# Patient Record
Sex: Female | Born: 1995 | Race: White | Hispanic: No | Marital: Single | Smoking: Never smoker
Health system: Southern US, Community
[De-identification: ages and names within clinical notes are randomized; demographics above are authoritative.]

## PROBLEM LIST (undated history)

## (undated) DIAGNOSIS — E785 Hyperlipidemia, unspecified: Secondary | ICD-10-CM

## (undated) HISTORY — PX: OTHER SURGICAL HISTORY: SHX169

---
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
## 2016-01-22 ENCOUNTER — Other Ambulatory Visit: Payer: Self-pay | Admitting: Gynecology

## 2016-09-13 ENCOUNTER — Ambulatory Visit (INDEPENDENT_AMBULATORY_CARE_PROVIDER_SITE_OTHER): Payer: BLUE CROSS/BLUE SHIELD | Admitting: Gynecology

## 2016-09-13 ENCOUNTER — Encounter: Payer: Self-pay | Admitting: Gynecology

## 2016-09-13 VITALS — BP 112/70 | Ht 63.5 in | Wt 115.0 lb

## 2016-09-13 DIAGNOSIS — Z01411 Encounter for gynecological examination (general) (routine) with abnormal findings: Secondary | ICD-10-CM

## 2016-09-13 DIAGNOSIS — R5383 Other fatigue: Secondary | ICD-10-CM | POA: Diagnosis not present

## 2016-09-13 LAB — COMPREHENSIVE METABOLIC PANEL
ALK PHOS: 42 U/L — AB (ref 47–176)
ALT: 10 U/L (ref 5–32)
AST: 12 U/L (ref 12–32)
Albumin: 4.2 g/dL (ref 3.6–5.1)
BUN: 16 mg/dL (ref 7–20)
CO2: 23 mmol/L (ref 20–31)
Calcium: 9.3 mg/dL (ref 8.9–10.4)
Chloride: 105 mmol/L (ref 98–110)
Creat: 0.89 mg/dL (ref 0.50–1.00)
Glucose, Bld: 96 mg/dL (ref 65–99)
Potassium: 4.4 mmol/L (ref 3.8–5.1)
SODIUM: 137 mmol/L (ref 135–146)
Total Bilirubin: 0.3 mg/dL (ref 0.2–1.1)
Total Protein: 6.7 g/dL (ref 6.3–8.2)

## 2016-09-13 LAB — CBC WITH DIFFERENTIAL/PLATELET
Basophils Absolute: 54 cells/uL (ref 0–200)
Basophils Relative: 1 %
EOS PCT: 2 %
Eosinophils Absolute: 108 cells/uL (ref 15–500)
HCT: 40.6 % (ref 35.0–45.0)
HEMOGLOBIN: 13.8 g/dL (ref 11.7–15.5)
LYMPHS ABS: 2160 {cells}/uL (ref 850–3900)
Lymphocytes Relative: 40 %
MCH: 28 pg (ref 27.0–33.0)
MCHC: 34 g/dL (ref 32.0–36.0)
MCV: 82.4 fL (ref 80.0–100.0)
MONOS PCT: 9 %
MPV: 8.9 fL (ref 7.5–12.5)
Monocytes Absolute: 486 cells/uL (ref 200–950)
NEUTROS PCT: 48 %
Neutro Abs: 2592 cells/uL (ref 1500–7800)
PLATELETS: 238 10*3/uL (ref 140–400)
RBC: 4.93 MIL/uL (ref 3.80–5.10)
RDW: 12.8 % (ref 11.0–15.0)
WBC: 5.4 10*3/uL (ref 3.8–10.8)

## 2016-09-13 LAB — CHOLESTEROL, TOTAL: CHOLESTEROL: 181 mg/dL — AB (ref <170)

## 2016-09-13 LAB — TSH: TSH: 2.05 m[IU]/L (ref 0.50–4.30)

## 2016-09-13 NOTE — Progress Notes (Signed)
   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 She has done well since we started her on norethindrone-ethinyl estradiol 1/35 (Ortho-Novum). She reports normal regular cycles. Is also help with her acne as well. She is not sexually active. She has completed the Gardasil Vaccine series at her pediatrician's office several years ago.  Exam: Gen. appearance well-developed: Nursing underwent acute distress HEENT: Unremarkable Lungs: Clear to auscultation rhonchus or wheezes Heart: Regular rate and rhythm no murmurs or gallops Breast exam: Not done Abdomen and pelvic exam not done Pelvic exam: Not done  Assessment/plan: 20 year old with normal annual exam because of tiredness and fatigue were going to check a comprehensive metabolic panel today along with a TSH, CBC and vitamin D level and urinalysis. She was provided a prescription refill for oral contraceptive pill. She declined the flu vaccine today. Otherwise patient scheduled to return back in one year or when necessary.

## 2016-09-14 LAB — URINALYSIS W MICROSCOPIC + REFLEX CULTURE
Bilirubin Urine: NEGATIVE
Casts: NONE SEEN [LPF]
Crystals: NONE SEEN [HPF]
GLUCOSE, UA: NEGATIVE
HGB URINE DIPSTICK: NEGATIVE
KETONES UR: NEGATIVE
NITRITE: NEGATIVE
PH: 5.5 (ref 5.0–8.0)
Protein, ur: NEGATIVE
RBC / HPF: NONE SEEN RBC/HPF (ref <=2)
SPECIFIC GRAVITY, URINE: 1.023 (ref 1.001–1.035)
Yeast: NONE SEEN [HPF]

## 2016-09-14 LAB — VITAMIN D 25 HYDROXY (VIT D DEFICIENCY, FRACTURES): VIT D 25 HYDROXY: 51 ng/mL (ref 30–100)

## 2016-09-16 LAB — URINE CULTURE

## 2016-09-17 ENCOUNTER — Encounter: Payer: Self-pay | Admitting: Gynecology

## 2016-09-17 ENCOUNTER — Other Ambulatory Visit: Payer: Self-pay | Admitting: Gynecology

## 2016-09-18 ENCOUNTER — Other Ambulatory Visit: Payer: Self-pay

## 2016-09-18 MED ORDER — NORETHINDRONE-ETH ESTRADIOL 1-35 MG-MCG PO TABS: 1.0000 | ORAL_TABLET | Freq: Every day | ORAL | 3 refills | Status: DC

## 2016-10-15 ENCOUNTER — Other Ambulatory Visit: Payer: Self-pay | Admitting: Gynecology

## 2016-10-15 DIAGNOSIS — E7800 Pure hypercholesterolemia, unspecified: Secondary | ICD-10-CM

## 2016-10-23 ENCOUNTER — Encounter: Payer: Self-pay | Admitting: Gynecology

## 2017-02-12 ENCOUNTER — Encounter: Payer: Self-pay | Admitting: Gynecology

## 2017-02-17 ENCOUNTER — Other Ambulatory Visit: Payer: Self-pay | Admitting: *Deleted

## 2017-02-17 MED ORDER — NORETHINDRONE-ETH ESTRADIOL 1-35 MG-MCG PO TABS: 1.0000 | ORAL_TABLET | Freq: Every day | ORAL | 1 refills | Status: DC

## 2017-02-18 ENCOUNTER — Other Ambulatory Visit: Payer: Self-pay

## 2017-02-18 MED ORDER — NORETHINDRONE-ETH ESTRADIOL 1-35 MG-MCG PO TABS: 1.0000 | ORAL_TABLET | Freq: Every day | ORAL | 1 refills | Status: DC

## 2017-05-30 ENCOUNTER — Other Ambulatory Visit: Payer: Self-pay | Admitting: Pediatrics

## 2017-05-30 ENCOUNTER — Ambulatory Visit
Admission: RE | Admit: 2017-05-30 | Discharge: 2017-05-30 | Disposition: A | Discharge status: Home / Self Care | Payer: BLUE CROSS/BLUE SHIELD | Source: Ambulatory Visit | Attending: Pediatrics | Admitting: Pediatrics

## 2017-05-30 DIAGNOSIS — R079 Chest pain, unspecified: Secondary | ICD-10-CM

## 2017-05-30 DIAGNOSIS — R1012 Left upper quadrant pain: Secondary | ICD-10-CM

## 2017-07-07 ENCOUNTER — Other Ambulatory Visit: Payer: Self-pay

## 2017-07-07 MED ORDER — NORETHINDRONE-ETH ESTRADIOL 1-35 MG-MCG PO TABS: 1.0000 | ORAL_TABLET | Freq: Every day | ORAL | 0 refills | Status: DC

## 2017-09-01 ENCOUNTER — Other Ambulatory Visit: Payer: Self-pay | Admitting: *Deleted

## 2017-09-01 MED ORDER — NORETHINDRONE-ETH ESTRADIOL 1-35 MG-MCG PO TABS: 1.0000 | ORAL_TABLET | Freq: Every day | ORAL | 0 refills | Status: DC

## 2017-10-07 ENCOUNTER — Encounter: Payer: Self-pay | Admitting: Obstetrics & Gynecology

## 2017-10-07 ENCOUNTER — Ambulatory Visit (INDEPENDENT_AMBULATORY_CARE_PROVIDER_SITE_OTHER): Payer: BLUE CROSS/BLUE SHIELD | Admitting: Obstetrics & Gynecology

## 2017-10-07 VITALS — BP 112/70

## 2017-10-07 DIAGNOSIS — Z01419 Encounter for gynecological examination (general) (routine) without abnormal findings: Secondary | ICD-10-CM

## 2017-10-07 DIAGNOSIS — Z793 Long term (current) use of hormonal contraceptives: Secondary | ICD-10-CM

## 2017-10-07 DIAGNOSIS — N946 Dysmenorrhea, unspecified: Secondary | ICD-10-CM

## 2017-10-07 MED ORDER — NORETHINDRONE-ETH ESTRADIOL 1-35 MG-MCG PO TABS: 1.0000 | ORAL_TABLET | Freq: Every day | ORAL | 4 refills | Status: DC

## 2017-10-07 NOTE — Patient Instructions (Signed)
1. Well female exam with routine gynecological exam Normal general exam, gynecologic exam deferred reason virgin.  2. Dysmenorrhea treated with oral contraceptive Will continue on birth control pill for cycle control.  Continuous usage recommended for dysmenorrhea.  Instructions discussed.  No contraindication to birth control pill.  Re-prescription sent.  Other orders - norethindrone-ethinyl estradiol 1/35 (ALAYCEN 1/35) tablet; Take 1 tablet by mouth daily. Continuous use recommended for Dysmenorrhea/PMS.  Victoria Decker, which was a pleasure meeting you today!   Preventive Care for Victoria Decker, Female The transition to life after high school as a young adult can be a stressful time with many changes. You may start seeing a primary care physician instead of a pediatrician. This is the time when your health care becomes your responsibility. Preventive care refers to lifestyle choices and visits with your health care provider that can promote health and wellness. What does preventive care include?  A yearly physical exam. This is also called an annual wellness visit.  Dental exams once or twice a year.  Routine eye exams. Ask your health care provider how often you should have your eyes checked.  Personal lifestyle choices, including: ? Daily care of your teeth and gums. ? Regular physical activity. ? Eating a healthy diet. ? Avoiding tobacco and drug use. ? Avoiding or limiting alcohol use. ? Practicing safe sex. ? Taking vitamin and mineral supplements as recommended by your health care provider. What happens during an annual wellness visit? Preventive care starts with a yearly visit to your primary care physician. The services and screenings done by your health care provider during your annual wellness visit will depend on your overall health, lifestyle risk factors, and family history of disease. Counseling Your health care provider may ask you questions about:  Past medical  problems and your family's medical history.  Medicines or supplements you take.  Health insurance and access to health care.  Alcohol, tobacco, and drug use.  Your safety at home, work, or school.  Access to firearms.  Emotional well-being and how you cope with stress.  Relationship well-being.  Diet, exercise, and sleep habits.  Your sexual health and activity.  Your methods of birth control.  Your menstrual cycle.  Your pregnancy history.  Screening You may have the following tests or measurements:  Height, weight, and BMI.  Blood pressure.  Lipid and cholesterol levels.  Tuberculosis skin test.  Skin exam.  Vision and hearing tests.  Screening test for hepatitis.  Screening tests for sexually transmitted diseases (STDs), if you are at risk.  BRCA-related cancer screening. This may be done if you have a family history of breast, ovarian, tubal, or peritoneal cancers.  Pelvic exam and Pap test. This may be done every 3 years starting at age 62.  Vaccines Your health care provider may recommend certain vaccines, such as:  Influenza vaccine. This is recommended every year.  Tetanus, diphtheria, and acellular pertussis (Tdap, Td) vaccine. You may need a Td booster every 10 years.  Varicella vaccine. You may need this if you have not been vaccinated.  HPV vaccine. If you are 36 or younger, you may need three doses over 6 months.  Measles, mumps, and rubella (MMR) vaccine. You may need at least one dose of MMR. You may also need a second dose.  Pneumococcal 13-valent conjugate (PCV13) vaccine. You may need this if you have certain conditions and were not previously vaccinated.  Pneumococcal polysaccharide (PPSV23) vaccine. You may need one or two doses if you smoke cigarettes  or if you have certain conditions.  Meningococcal vaccine. One dose is recommended if you are age 48-21 years and a first-year college student living in a residence hall, or if you  have one of several medical conditions. You may also need additional booster doses.  Hepatitis A vaccine. You may need this if you have certain conditions or if you travel or work in places where you may be exposed to hepatitis A.  Hepatitis B vaccine. You may need this if you have certain conditions or if you travel or work in places where you may be exposed to hepatitis B.  Haemophilus influenzae type b (Hib) vaccine. You may need this if you have certain risk factors.  Talk to your health care provider about which screenings and vaccines you need and how often you need them. What steps can I take to develop healthy behaviors?  Have regular preventive health care visits with your primary care physician and dentist.  Eat a healthy diet.  Drink enough fluid to keep your urine clear or pale yellow.  Stay active. Exercise at least 30 minutes 5 or more days of the week.  Use alcohol responsibly.  Maintain a healthy weight.  Do not use any products that contain nicotine, such as cigarettes, chewing tobacco, and e-cigarettes. If you need help quitting, ask your health care provider.  Do not use drugs.  Practice safe sex.  Use birth control (contraception) to prevent unwanted pregnancy. If you plan to become pregnant, see your health care provider for a pre-conception visit.  Find healthy ways to manage stress. How can I protect myself from injury? Injuries from violence or accidents are the leading cause of death among young adults and can often be prevented. Take these steps to help protect yourself:  Always wear your seat belt while driving or riding in a vehicle.  Do not drive if you have been drinking alcohol. Do not ride with someone who has been drinking.  Do not drive when you are tired or distracted. Do not text while driving.  Wear a helmet and other protective equipment during sports activities.  If you have firearms in your house, make sure you follow all gun safety  procedures.  Seek help if you have been bullied, physically abused, or sexually abused.  Use the Internet responsibly to avoid dangers such as online bullying and online sexual predators.  What can I do to cope with stress? Young adults may face many new challenges that can be stressful, such as finding a job, going to college, moving away from home, managing money, being in a relationship, getting married, and having children. To manage stress:  Avoid known stressful situations when you can.  Exercise regularly.  Find a stress-reducing activity that works best for you. Examples include meditation, yoga, listening to music, or reading.  Spend time in nature.  Keep a journal to write about your stress and how you respond.  Talk to your health care provider about stress. He or she may suggest counseling.  Spend time with supportive friends or family.  Do not cope with stress by: ? Drinking alcohol or using drugs. ? Smoking cigarettes. ? Eating.  Where can I get more information? Learn more about preventive care and healthy habits from:  [place] and Gynecologists: KaraokeExchange.nl  U.S. Probation officer Task Force: StageSync.si  National Adolescent and [place]: StrategicRoad.nl  American Academy of Pediatrics Bright Futures: https://brightfutures.MemberVerification.co.za  Society for Adolescent Health and Medicine: MoralBlog.co.za.aspx  PodExchange.nl: ToyLending.fr  This information is not intended to replace advice given to you by your health care provider. Make sure you discuss any questions you have with your health care provider. Document Released: 02/01/2016 Document Revised: 02/22/2016 Document Reviewed:  02/01/2016 Elsevier Interactive Patient Education  [employer].

## 2017-10-07 NOTE — Progress Notes (Signed)
    Victoria BlareCaitlyn B Decker 19-Feb-1996 [id number]   History:    22 y.o. G0 Single.  Virgin.  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  Virgin, no boyfriend.  Because of uterine cramps during her periods, she has been using the pill continuously with success.  Acne and PMS well controlled on birth control pills.  No pelvic pain.  Breasts normal.  Urine and bowel movements normal.  Very active physically with softball.  Still sees her pediatrician Dr. Donnie Coffinubin.  Was complaining of intermittent left upper quadrant pain/lower left rib cage pain.  Dr. Donnie Coffinubin ordered an x-Piano which came back negative.  Past medical history,surgical history, family history and social history were all reviewed and documented in the EPIC chart.  Gynecologic History Patient's last menstrual period was 09/30/2017. Contraception: OCP (estrogen/progesterone)/Abstinence Last Pap: Never Last mammogram:  N/A  Obstetric History OB History  Gravida Para Term Preterm AB Living  0 0 0 0 0 0  SAB TAB Ectopic Multiple Live Births  0 0 0 0 0         ROS: A ROS was performed and pertinent positives and negatives are included in the history.  GENERAL: No fevers or chills. HEENT: No change in vision, no earache, sore throat or sinus congestion. NECK: No pain or stiffness. CARDIOVASCULAR: No chest pain or pressure. No palpitations. PULMONARY: No shortness of breath, cough or wheeze. GASTROINTESTINAL: No abdominal pain, nausea, vomiting or diarrhea, melena or bright red blood per rectum. GENITOURINARY: No urinary frequency, urgency, hesitancy or dysuria. MUSCULOSKELETAL: No joint or muscle pain, no back pain, no recent trauma. DERMATOLOGIC: No rash, no itching, no lesions. ENDOCRINE: No polyuria, polydipsia, no heat or cold intolerance. No recent change in weight. HEMATOLOGICAL: No anemia or easy bruising or bleeding.  NEUROLOGIC: No headache, seizures, numbness, tingling or weakness. PSYCHIATRIC: No depression, no loss of interest in normal activity or change in sleep pattern.     Exam:   BP 112/70   LMP 09/30/2017 Comment: pill  There is no height or weight on file to calculate BMI.  General appearance : Well developed well nourished female. No acute distress HEENT: Eyes: no retinal hemorrhage or exudates,  Neck supple, trachea midline, no carotid bruits, no thyroidmegaly Lungs: Clear to auscultation, no rhonchi or wheezes, or rib retractions  Heart: Regular rate and rhythm, no murmurs or gallops Breast: No complaint, deferred to next year. Abdomen: no palpable masses or tenderness, no rebound or guarding Extremities: no edema or skin discoloration or tenderness  Pelvic: Deferred, re virgin.   Assessment/Plan:  22 y.o. female for annual exam   1. Well female exam with routine gynecological exam Normal general exam, gynecologic exam deferred reason virgin.  2. Dysmenorrhea treated with oral contraceptive Will continue on birth control pill for cycle control.  Continuous usage recommended for dysmenorrhea.  Instructions discussed.  No contraindication to birth control pill.  Re-prescription sent.  Other orders - norethindrone-ethinyl estradiol 1/35 (ALAYCEN 1/35) tablet; Take 1 tablet by mouth daily. Continuous use recommended for Dysmenorrhea/PMS.  Victoria DelMarie-Lyne Rielle Schlauch MD, 4:04 PM 10/07/2017

## 2018-01-22 ENCOUNTER — Other Ambulatory Visit: Payer: Self-pay

## 2018-01-22 MED ORDER — NORETHINDRONE-ETH ESTRADIOL 1-35 MG-MCG PO TABS: 1.0000 | ORAL_TABLET | Freq: Every day | ORAL | 4 refills | Status: DC

## 2018-06-12 ENCOUNTER — Ambulatory Visit (INDEPENDENT_AMBULATORY_CARE_PROVIDER_SITE_OTHER): Payer: BLUE CROSS/BLUE SHIELD | Admitting: Family Medicine

## 2018-06-12 ENCOUNTER — Ambulatory Visit (INDEPENDENT_AMBULATORY_CARE_PROVIDER_SITE_OTHER): Payer: BLUE CROSS/BLUE SHIELD

## 2018-06-12 ENCOUNTER — Encounter (INDEPENDENT_AMBULATORY_CARE_PROVIDER_SITE_OTHER): Payer: Self-pay | Admitting: Family Medicine

## 2018-06-12 DIAGNOSIS — M25521 Pain in right elbow: Secondary | ICD-10-CM

## 2018-06-12 DIAGNOSIS — M79631 Pain in right forearm: Secondary | ICD-10-CM | POA: Diagnosis not present

## 2018-06-12 NOTE — Progress Notes (Signed)
   Office Visit Note   Patient: Victoria Decker           Date of Birth: 22-Apr-1996           MRN: [id number] Visit Date: 06/12/2018 Requested by: Victoria Dolphin, MD [address] [place], [place] [address] PCP: Victoria Dolphin, MD  Subjective: Chief Complaint  Patient presents with  . Right Forearm - Pain    HPI: She is a 22 year old right-hand-dominant female with right forearm pain.  In March was playing softball and was hit by a pitch in her forearm resulting in a distal ulna shaft fracture.  She underwent ORIF in [place].  She was cleared to return to play after 5 weeks but she has continued to have pain on the ulnar side of her forearm.  It bothers her from the elbow to her fourth and fifth fingers.  Intermittent tingling in her fingers.  This is going to be her senior season and she is frustrated by her ongoing pain.  No previous problems with her arm.  She is otherwise in excellent health.              ROS: Noncontributory  Objective: Vital Signs: There were no vitals taken for this visit.  Physical Exam:  Right arm: Full range of motion of the elbow and wrist.  No swelling.  Surgical scar is well-healed.  She is tender to palpation around the distal ulna near where her fracture was.  Normal sensation in the hand, intrinsic hand strength is normal.  Imaging: Anatomic alignment of the fracture with complete healing.  Surgical hardware is intact, no sign of loosening.  Assessment & Plan: 1.  Right forearm pain 6 months status post ORIF ulna fracture -I believe she would benefit from surgical hardware removal.  Dr. Lorin Decker met with her as well today.  We will have his scheduler call her to arrange for partial removal at her convenience.  She understands that she will need to be cautious for 3 to 4 weeks afterward.  There is a small chance that it could re-break with strenuous activity.    Follow-Up Instructions: No follow-ups on file.       Procedures:  None  today.    PMFS History: There are no active problems to display for this patient.  No past medical history on file.  Family History  Problem Relation Age of Onset  . Cancer Maternal Uncle        LUNG  . Cancer Paternal Aunt        BRAIN  . Breast cancer Paternal Aunt   . Heart disease Paternal Uncle   . Diabetes Maternal Grandmother   . Heart disease Maternal Grandmother   . Diabetes Maternal Grandfather   . Heart disease Paternal Grandfather     No past surgical history on file. Social History   Occupational History  . Not on file  Tobacco Use  . Smoking status: Never Smoker  . Smokeless tobacco: Never Used  Substance and Sexual Activity  . Alcohol use: No    Alcohol/week: 0.0 standard drinks  . Drug use: Not on file  . Sexual activity: Never

## 2018-06-16 ENCOUNTER — Telehealth (INDEPENDENT_AMBULATORY_CARE_PROVIDER_SITE_OTHER): Payer: Self-pay | Admitting: Family Medicine

## 2018-06-16 NOTE — Telephone Encounter (Signed)
At this point, she has not stopped playing.  Mom said that she or the patient's dad will call her and try to convince her to stop playing softball, as the patient's pain comes after training or a game.  She will also pass on to her (the patient) that she can take Tylenol, but no antiinflammatories.

## 2018-06-16 NOTE — Telephone Encounter (Signed)
Patients mom called and is wondering if there is any kind of pain medication patient can take? She said she is having a lot of pain in her arm and that she wont have surgery until ball season is over. Please advise mom, Victoria DikeJennifer  # [phone]

## 2018-06-16 NOTE — Telephone Encounter (Signed)
She has stopped playing softball, correct?  If not, then she needs to stop.  Advil and Aleve can inhibit bone healing, so she should avoid these.    Tylenol is the best option.

## 2018-06-16 NOTE — Telephone Encounter (Signed)
I spoke with Mom: patient has tried Tylenol and Aleve for the pain, but these are not helping.  She says the original surgeon told her not to take Advil, so as to not interfere with bone healing.  Is there something else she can try?  Mom is checking on a pharmacy, in case we prescribe something.  Please advise.

## 2018-06-19 ENCOUNTER — Telehealth (INDEPENDENT_AMBULATORY_CARE_PROVIDER_SITE_OTHER): Payer: Self-pay | Admitting: Family Medicine

## 2018-06-19 DIAGNOSIS — M25521 Pain in right elbow: Secondary | ICD-10-CM

## 2018-06-19 NOTE — Telephone Encounter (Signed)
Please help with right elbow MRI and x-[place]ell scheduling at [facility].  Also, she hasn't yet been contacted to schedule surgery with Dr. Ophelia CharterYates.

## 2018-06-19 NOTE — Telephone Encounter (Signed)
Patients mom, Victorino DikeJennifer has a few more questions she would like for Dr. Prince RomeHilts to call her back and discuss. She did not explain what they were concerning. Her # [phone]

## 2018-06-19 NOTE — Telephone Encounter (Signed)
Advised Victorino DikeJennifer that [facility] will be calling her to schedule the tests.  Eunice BlaseDebbie has spoken with her about scheduling the surgery.

## 2018-06-19 NOTE — Telephone Encounter (Signed)
I spoke to the patient's mother today.  Patient is concerned that even before fracturing her arm, she has had ongoing intermittent problems with her elbow.  She wants to be sure that surgical hardware removal will eliminate all of her pain.  We will therefore order an elbow MRI scan.  If unremarkable, then she will proceed with surgical hardware removal at the soonest convenience.  If her pain persist, then nerve conduction studies after that.

## 2018-06-22 ENCOUNTER — Telehealth (INDEPENDENT_AMBULATORY_CARE_PROVIDER_SITE_OTHER): Payer: Self-pay | Admitting: Family Medicine

## 2018-06-22 NOTE — Telephone Encounter (Signed)
Patient's mother called concerning the MRI for her daughter.  She wanted to know if there was something she needed to do for the second step in order to get the appointment for her MRI.  CB#[phone].  Thank you.

## 2018-06-23 ENCOUNTER — Telehealth (INDEPENDENT_AMBULATORY_CARE_PROVIDER_SITE_OTHER): Payer: Self-pay | Admitting: *Deleted

## 2018-06-23 NOTE — Telephone Encounter (Signed)
Yes, that's fine.  Have her call afterward to remind me to look.

## 2018-06-23 NOTE — Telephone Encounter (Signed)
Patients mom called, they have MRI scheduled for Thursday 9/26. She is wondering if Dr. Prince RomeHilts could give her a call once results are in, she said they are really trying to hurry this process and with the patients schedule and school being so far away it would be easier to have results given over the phone to determine if NCS is needed, then proceed with surgery. Please call on home first # [phone], then cell # [phone]

## 2018-06-24 NOTE — Telephone Encounter (Signed)
IC s/w dad at home and advised.

## 2018-06-25 ENCOUNTER — Telehealth (INDEPENDENT_AMBULATORY_CARE_PROVIDER_SITE_OTHER): Payer: Self-pay | Admitting: Family Medicine

## 2018-06-25 ENCOUNTER — Ambulatory Visit
Admission: RE | Admit: 2018-06-25 | Discharge: 2018-06-25 | Disposition: A | Discharge status: Home / Self Care | Payer: BLUE CROSS/BLUE SHIELD | Source: Ambulatory Visit | Attending: Family Medicine | Admitting: Family Medicine

## 2018-06-25 DIAGNOSIS — M25521 Pain in right elbow: Secondary | ICD-10-CM

## 2018-06-25 NOTE — Telephone Encounter (Signed)
IC patients mom advised of results. She verbalized understanding. She stated that they were ready to proceed with having hardware removed by Dr Ophelia Charter. Eunice Blase can you please call her to get date scheduled? Thanks.

## 2018-06-25 NOTE — Telephone Encounter (Signed)
Ok . Think I already did blue sheet. Has synthes 3.4 compression plate and need osteotomes and small fragment synthes screwdriver. Tournaquet. outpt surgery.   1 hr time

## 2018-06-25 NOTE — Telephone Encounter (Signed)
Fyi. Please see below. Patient had MRI of elbow and is going to go ahead and schedule removal of hardware.

## 2018-06-25 NOTE — Telephone Encounter (Signed)
Elbow MRI scan shows mild tendinopathy of the brachialis tendon but no other abnormality, no indication for elbow surgery.  If elbow pain persists after having forearm surgical hardware removed, could try physical therapy for it.

## 2018-06-25 NOTE — Telephone Encounter (Signed)
synthes 3.5 ( Not 3.4. )

## 2018-06-25 NOTE — Telephone Encounter (Signed)
IC patients mom advised of results. She verbalized understanding. She stated that they were ready to proceed with having hardware removed by Dr Ophelia Charter. Eunice Blase can you please call her to get date scheduled? Thanks. Per Kathryne Gin.

## 2018-06-25 NOTE — Telephone Encounter (Signed)
Patient;s mother Victorino Dike called asked for a call back with the results of the MRI. The number to contact Victorino Dike is [phone]

## 2018-06-25 NOTE — Telephone Encounter (Signed)
Noted  

## 2018-06-26 NOTE — Telephone Encounter (Signed)
Please see below for surgery scheduling.

## 2018-07-06 DIAGNOSIS — T8484XA Pain due to internal orthopedic prosthetic devices, implants and grafts, initial encounter: Secondary | ICD-10-CM | POA: Diagnosis not present

## 2018-07-07 ENCOUNTER — Telehealth (INDEPENDENT_AMBULATORY_CARE_PROVIDER_SITE_OTHER): Payer: Self-pay | Admitting: Orthopaedic Surgery

## 2018-07-07 NOTE — Telephone Encounter (Signed)
Patient mother called to state pain medication is too strong and patient is nausea.  Wanted to call in Tramadol it has helped her in the past.  Please call patient mother Victorino Dike to advise [phone]

## 2018-07-07 NOTE — Telephone Encounter (Signed)
noted 

## 2018-07-07 NOTE — Telephone Encounter (Signed)
I called discussed. Can use one fourth or one half tablet . Ice , elevation.

## 2018-07-07 NOTE — Telephone Encounter (Signed)
Please advise on medication

## 2018-07-09 ENCOUNTER — Telehealth (INDEPENDENT_AMBULATORY_CARE_PROVIDER_SITE_OTHER): Payer: Self-pay

## 2018-07-09 NOTE — Telephone Encounter (Signed)
I called patient's mom and advised. 

## 2018-07-09 NOTE — Telephone Encounter (Signed)
Take NSAID OTC if not already which should help

## 2018-07-09 NOTE — Telephone Encounter (Signed)
Mom left message on triage phone. Pt had surgery on Monday. Removal of right ulna plate. Still having some swelling but nothing like right after surgery as well as some numbness in her ring and pinky finger and wants to know if this is normal?

## 2018-07-09 NOTE — Telephone Encounter (Signed)
I called and spoke with Victorino Dike, patient's mother. She states that the ring and pinky finger feel like they have "needles poking in them'.  I advised that when the nerve is irritated, it can take some time for it to settle down. Patient does have some swelling, but mom states it is no where near as bad as when she broke the arm the first time. Mother would like to be sure that there is nothing that you recommend and that it is ok to send daughter back to school this afternoon as it is 2 1/2 hours away.  Please advise.

## 2018-07-14 ENCOUNTER — Encounter (INDEPENDENT_AMBULATORY_CARE_PROVIDER_SITE_OTHER): Payer: Self-pay | Admitting: Orthopaedic Surgery

## 2018-07-14 ENCOUNTER — Inpatient Hospital Stay (INDEPENDENT_AMBULATORY_CARE_PROVIDER_SITE_OTHER): Payer: BLUE CROSS/BLUE SHIELD | Admitting: Orthopaedic Surgery

## 2018-07-14 ENCOUNTER — Ambulatory Visit (INDEPENDENT_AMBULATORY_CARE_PROVIDER_SITE_OTHER): Payer: BLUE CROSS/BLUE SHIELD | Admitting: Orthopaedic Surgery

## 2018-07-14 VITALS — BP 128/80 | HR 80 | Ht 63.0 in | Wt 120.0 lb

## 2018-07-14 DIAGNOSIS — M25531 Pain in right wrist: Secondary | ICD-10-CM

## 2018-07-14 NOTE — Progress Notes (Signed)
   Post-Op Visit Note   Patient: Victoria Decker           Date of Birth: 05-21-1996           MRN: [id number] Visit Date: 07/14/2018 PCP: Maryellen Pile, MD   Assessment & Plan: Postop plate removal ulna with ulnar sensory nerve stuck in the empty hole that was in the plate at the level of the fracture.  Steri-Strips changed incision looks good.  I plan to check her back in a month.  She will avoid weight lifting push-ups etc. until I see her back.  She can do abs, legs, and cardio work.  Chief Complaint:  Chief Complaint  Patient presents with  . Right Forearm - Routine Post Op    07/06/18  Removal Right Ulna Plate   Visit Diagnoses:  1. Pain in right wrist     Plan: Post ulnar plate removal with entrapment and neural lysis of ulnar sensory nerve which was stuck in the empty hole in the plate at the level of the fracture.  Nerve was released plate was removed.  Incision looks good recheck 1 month.  Follow-Up Instructions: Return in about 1 month (around 08/14/2018).   Orders:  No orders of the defined types were placed in this encounter.  No orders of the defined types were placed in this encounter.   Imaging: No results found.  PMFS History: There are no active problems to display for this patient.  No past medical history on file.  Family History  Problem Relation Age of Onset  . Cancer Maternal Uncle        LUNG  . Cancer Paternal Aunt        BRAIN  . Breast cancer Paternal Aunt   . Heart disease Paternal Uncle   . Diabetes Maternal Grandmother   . Heart disease Maternal Grandmother   . Diabetes Maternal Grandfather   . Heart disease Paternal Grandfather     No past surgical history on file. Social History   Occupational History  . Not on file  Tobacco Use  . Smoking status: Never Smoker  . Smokeless tobacco: Never Used  Substance and Sexual Activity  . Alcohol use: No    Alcohol/week: 0.0 standard drinks  . Drug use: Not on file  . Sexual activity: Never

## 2018-08-07 ENCOUNTER — Ambulatory Visit (INDEPENDENT_AMBULATORY_CARE_PROVIDER_SITE_OTHER): Payer: BLUE CROSS/BLUE SHIELD | Admitting: Orthopaedic Surgery

## 2018-08-07 ENCOUNTER — Encounter (INDEPENDENT_AMBULATORY_CARE_PROVIDER_SITE_OTHER): Payer: Self-pay | Admitting: Orthopaedic Surgery

## 2018-08-07 VITALS — BP 135/83 | HR 76 | Ht 63.0 in | Wt 120.0 lb

## 2018-08-07 DIAGNOSIS — Z9889 Other specified postprocedural states: Secondary | ICD-10-CM

## 2018-08-07 NOTE — Progress Notes (Signed)
   Post-Op Visit Note   Patient: Victoria Decker           Date of Birth: 1996/02/24           MRN: [id number] Visit Date: 08/07/2018 PCP: Maryellen Pile, MD   Assessment & Plan: Post plate removal right distal ulna with neural lysis from superficial ulnar sensory nerve entrapment in the empty screw hole directly over the healed fracture.  Incision looks good she will begin throwing a ball with her father see how she does and swing a bat in the air.  She has no pain she can go to the batting cage.  Follow-up here will be on a as needed basis.  Chief Complaint:  Chief Complaint  Patient presents with  . Right Forearm - Follow-up    07/06/18 Removal Right Ulnar Plate   Visit Diagnoses:  1. Status post hardware removal     Plan: Patient is released from care she will call she has any questions.  When she throws with her father she is not having pain she will call and give [place] the fax number we can fax the release for softball back to her school.  She is open doors and noticed she no longer has the sharp pain in the wrist that she had previously.  Follow-Up Instructions: Return if symptoms worsen or fail to improve.   Orders:  No orders of the defined types were placed in this encounter.  No orders of the defined types were placed in this encounter.   Imaging: No results found.  PMFS History: There are no active problems to display for this patient.  No past medical history on file.  Family History  Problem Relation Age of Onset  . Cancer Maternal Uncle        LUNG  . Cancer Paternal Aunt        BRAIN  . Breast cancer Paternal Aunt   . Heart disease Paternal Uncle   . Diabetes Maternal Grandmother   . Heart disease Maternal Grandmother   . Diabetes Maternal Grandfather   . Heart disease Paternal Grandfather     No past surgical history on file. Social History   Occupational History  . Not on file  Tobacco Use  . Smoking status: Never Smoker  . Smokeless tobacco: Never  Used  Substance and Sexual Activity  . Alcohol use: No    Alcohol/week: 0.0 standard drinks  . Drug use: Not on file  . Sexual activity: Never

## 2018-08-10 ENCOUNTER — Telehealth (INDEPENDENT_AMBULATORY_CARE_PROVIDER_SITE_OTHER): Payer: Self-pay | Admitting: Orthopaedic Surgery

## 2018-08-10 NOTE — Telephone Encounter (Signed)
Ok for note per Dr. Ophelia Charter. Note faxed and mailed.

## 2018-08-10 NOTE — Telephone Encounter (Signed)
Ok thank you 

## 2018-08-10 NOTE — Telephone Encounter (Signed)
Patient's mother requesting a release letter.  CB#[phone].  Thank you

## 2018-08-10 NOTE — Telephone Encounter (Signed)
I spoke with patient's mother. She states Victoria Decker and her dad threw balls on Friday and went to the batting cages. She is doing well with no complaints. Requests release to softball letter faxed to [phone] attn: [place].    OK for note?

## 2018-09-15 ENCOUNTER — Other Ambulatory Visit: Payer: Self-pay | Admitting: *Deleted

## 2018-09-15 MED ORDER — NORETHINDRONE-ETH ESTRADIOL 1-35 MG-MCG PO TABS: 1.0000 | ORAL_TABLET | Freq: Every day | ORAL | 2 refills | Status: DC

## 2018-09-15 NOTE — Telephone Encounter (Signed)
Patient cannot get AEX before returning to college, due after 10/07/18. Plays softball in spring semester therefore difficult to return home until after season is over. Therefore apt 6/2 at 3pm and refills sent to [facility]. KW CMA/ ML

## 2018-11-30 DIAGNOSIS — Z0000 Encounter for general adult medical examination without abnormal findings: Secondary | ICD-10-CM | POA: Diagnosis not present

## 2018-11-30 DIAGNOSIS — R5381 Other malaise: Secondary | ICD-10-CM | POA: Diagnosis not present

## 2018-12-03 ENCOUNTER — Encounter (INDEPENDENT_AMBULATORY_CARE_PROVIDER_SITE_OTHER): Payer: Self-pay | Admitting: Orthopaedic Surgery

## 2018-12-03 ENCOUNTER — Telehealth (INDEPENDENT_AMBULATORY_CARE_PROVIDER_SITE_OTHER): Payer: Self-pay | Admitting: Orthopaedic Surgery

## 2018-12-03 NOTE — Telephone Encounter (Signed)
I called discussed.  

## 2018-12-03 NOTE — Telephone Encounter (Signed)
Please see below.

## 2018-12-03 NOTE — Telephone Encounter (Signed)
Patient's mother Victoria Decker requesting a call back regarding a issue that's going on with Victoria Decker that they think has something to do with the surgery back from October 2019.

## 2018-12-03 NOTE — Telephone Encounter (Signed)
noted 

## 2018-12-04 NOTE — Telephone Encounter (Signed)
Dr. Ophelia Charter called and spoke with patient's mother.

## 2018-12-07 ENCOUNTER — Ambulatory Visit
Admission: RE | Admit: 2018-12-07 | Discharge: 2018-12-07 | Disposition: A | Discharge status: Home / Self Care | Payer: BLUE CROSS/BLUE SHIELD | Source: Ambulatory Visit | Attending: Pediatrics | Admitting: Pediatrics

## 2018-12-07 ENCOUNTER — Other Ambulatory Visit: Payer: Self-pay | Admitting: Pediatrics

## 2018-12-07 DIAGNOSIS — R0781 Pleurodynia: Secondary | ICD-10-CM | POA: Diagnosis not present

## 2018-12-07 DIAGNOSIS — R079 Chest pain, unspecified: Secondary | ICD-10-CM | POA: Diagnosis not present

## 2018-12-07 DIAGNOSIS — R5381 Other malaise: Secondary | ICD-10-CM | POA: Diagnosis not present

## 2018-12-07 DIAGNOSIS — R7982 Elevated C-reactive protein (CRP): Secondary | ICD-10-CM

## 2019-03-02 ENCOUNTER — Encounter: Payer: BLUE CROSS/BLUE SHIELD | Admitting: Obstetrics & Gynecology

## 2019-03-29 DIAGNOSIS — Z6823 Body mass index (BMI) 23.0-23.9, adult: Secondary | ICD-10-CM | POA: Diagnosis not present

## 2019-03-29 DIAGNOSIS — R5381 Other malaise: Secondary | ICD-10-CM | POA: Diagnosis not present

## 2019-03-29 DIAGNOSIS — Z0000 Encounter for general adult medical examination without abnormal findings: Secondary | ICD-10-CM | POA: Diagnosis not present

## 2019-03-29 DIAGNOSIS — Z713 Dietary counseling and surveillance: Secondary | ICD-10-CM | POA: Diagnosis not present

## 2019-03-29 DIAGNOSIS — Z7189 Other specified counseling: Secondary | ICD-10-CM | POA: Diagnosis not present

## 2019-04-23 ENCOUNTER — Other Ambulatory Visit: Payer: Self-pay

## 2019-04-26 ENCOUNTER — Other Ambulatory Visit: Payer: Self-pay

## 2019-04-26 ENCOUNTER — Ambulatory Visit (INDEPENDENT_AMBULATORY_CARE_PROVIDER_SITE_OTHER): Payer: BC Managed Care – PPO | Admitting: Obstetrics & Gynecology

## 2019-04-26 ENCOUNTER — Encounter: Payer: Self-pay | Admitting: Obstetrics & Gynecology

## 2019-04-26 VITALS — BP 120/78 | Ht 63.0 in | Wt 133.0 lb

## 2019-04-26 DIAGNOSIS — Z01419 Encounter for gynecological examination (general) (routine) without abnormal findings: Secondary | ICD-10-CM | POA: Diagnosis not present

## 2019-04-26 DIAGNOSIS — N946 Dysmenorrhea, unspecified: Secondary | ICD-10-CM

## 2019-04-26 DIAGNOSIS — Z793 Long term (current) use of hormonal contraceptives: Secondary | ICD-10-CM | POA: Diagnosis not present

## 2019-04-26 MED ORDER — ALYACEN 1/35 1-35 MG-MCG PO TABS: 1.0000 | ORAL_TABLET | Freq: Every day | ORAL | 5 refills | Status: DC

## 2019-04-26 NOTE — Progress Notes (Signed)
    Victoria Decker 01-26-1996 [id number]   History:    23 y.o. G0 Single/Virgin.  Undergrad graduation this year.  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  RP:  Established patient presenting for annual gyn exam   HPI: Well on continuous norethindrone ethinyl estradiol 1/35.  No breakthrough bleeding.  No pelvic pain.  [place].  Urine and bowel movements normal.  Breasts normal.  Body mass index 23.56.  Good fitness.  Healthy nutrition.   Past medical history,surgical history, family history and social history were all reviewed and documented in the EPIC chart.  Gynecologic History Patient's last menstrual period was 03/29/2019. Contraception: abstinence and OCP (estrogen/progesterone) Last Pap: Never Last mammogram: Never Bone Density: Never Colonoscopy: Never  Obstetric History OB History  Gravida Para Term Preterm AB Living  0 0 0 0 0 0  SAB TAB Ectopic Multiple Live Births  0 0 0 0 0     ROS: A ROS was performed and pertinent positives and negatives are included in the history.  GENERAL: No fevers or chills. HEENT: No change in vision, no earache, sore throat or sinus congestion. NECK: No pain or stiffness. CARDIOVASCULAR: No chest pain or pressure. No palpitations. PULMONARY: No shortness of breath, cough or wheeze. GASTROINTESTINAL: No abdominal pain, nausea, vomiting or diarrhea, melena or bright red blood per rectum. GENITOURINARY: No urinary frequency, urgency, hesitancy or dysuria. MUSCULOSKELETAL: No joint or muscle pain, no back pain, no recent trauma. DERMATOLOGIC: No rash, no itching, no lesions. ENDOCRINE: No polyuria, polydipsia, no heat or cold intolerance. No recent change in weight. HEMATOLOGICAL: No anemia or easy bruising or bleeding. NEUROLOGIC: No headache, seizures, numbness, tingling or weakness. PSYCHIATRIC: No depression, no loss of interest in normal activity or change in sleep pattern.     Exam:   BP 120/78   Ht 5\' 3"  (1.6 m)   Wt 133 lb  (60.3 kg)   LMP 03/29/2019 Comment: PILL  BMI 23.56 kg/m   Body mass index is 23.56 kg/m.  General appearance : Well developed well nourished female. No acute distress HEENT: Eyes: no retinal hemorrhage or exudates,  Neck supple, trachea midline, no carotid bruits, no thyroidmegaly Lungs: Clear to auscultation, no rhonchi or wheezes, or rib retractions  Heart: Regular rate and rhythm, no murmurs or gallops Breast:Examined in sitting and supine position were symmetrical in appearance, no palpable masses or tenderness,  no skin retraction, no nipple inversion, no nipple discharge, no skin discoloration, no axillary or supraclavicular lymphadenopathy Abdomen: no palpable masses or tenderness, no rebound or guarding Extremities: no edema or skin discoloration or tenderness  Pelvic: Deferred, re virgin    Assessment/Plan:  23 y.o. female for annual exam   1. Well female exam with routine gynecological exam Normal general exam and breast exam.  Gynecologic exam deferred since patient is a virgin and having no gynecologic complaints.  Body mass index good at 23.56.  Continue with fitness and healthy nutrition.  2. Dysmenorrhea treated with oral contraceptive Dysmenorrhea well controlled on continuous birth control pill with norethindrone ethinyl estradiol 1/35.  No contraindication to continue.  Prescription sent to pharmacy.  Other orders - norethindrone-ethinyl estradiol 1/35 (ALAYCEN 1/35) tablet; Take 1 tablet by mouth daily. Continuous use recommended for Dysmenorrhea/PMS.  Princess Bruins MD, 3:17 PM 04/26/2019

## 2019-04-26 NOTE — Patient Instructions (Signed)
1. Well female exam with routine gynecological exam Normal general exam and breast exam.  Gynecologic exam deferred since patient is a virgin and having no gynecologic complaints.  Body mass index good at 23.56.  Continue with fitness and healthy nutrition.  2. Dysmenorrhea treated with oral contraceptive Dysmenorrhea well controlled on continuous birth control pill with norethindrone ethinyl estradiol 1/35.  No contraindication to continue.  Prescription sent to pharmacy.  Other orders - norethindrone-ethinyl estradiol 1/35 (ALAYCEN 1/35) tablet; Take 1 tablet by mouth daily. Continuous use recommended for Dysmenorrhea/PMS.  Victoria Decker, it was a pleasure seeing you today!

## 2019-05-14 DIAGNOSIS — Z7189 Other specified counseling: Secondary | ICD-10-CM | POA: Diagnosis not present

## 2019-05-14 DIAGNOSIS — Z20828 Contact with and (suspected) exposure to other viral communicable diseases: Secondary | ICD-10-CM | POA: Diagnosis not present

## 2019-06-25 DIAGNOSIS — Z7189 Other specified counseling: Secondary | ICD-10-CM | POA: Diagnosis not present

## 2019-06-25 DIAGNOSIS — Z20828 Contact with and (suspected) exposure to other viral communicable diseases: Secondary | ICD-10-CM | POA: Diagnosis not present

## 2019-07-15 DIAGNOSIS — Z20828 Contact with and (suspected) exposure to other viral communicable diseases: Secondary | ICD-10-CM | POA: Diagnosis not present

## 2019-07-15 DIAGNOSIS — Z03818 Encounter for observation for suspected exposure to other biological agents ruled out: Secondary | ICD-10-CM | POA: Diagnosis not present

## 2019-07-15 DIAGNOSIS — Z7189 Other specified counseling: Secondary | ICD-10-CM | POA: Diagnosis not present

## 2019-08-05 DIAGNOSIS — Z20828 Contact with and (suspected) exposure to other viral communicable diseases: Secondary | ICD-10-CM | POA: Diagnosis not present

## 2019-08-05 DIAGNOSIS — Z7189 Other specified counseling: Secondary | ICD-10-CM | POA: Diagnosis not present

## 2019-08-06 DIAGNOSIS — Z03818 Encounter for observation for suspected exposure to other biological agents ruled out: Secondary | ICD-10-CM | POA: Diagnosis not present

## 2020-05-01 DIAGNOSIS — R21 Rash and other nonspecific skin eruption: Secondary | ICD-10-CM | POA: Diagnosis not present

## 2020-05-01 DIAGNOSIS — B356 Tinea cruris: Secondary | ICD-10-CM | POA: Diagnosis not present

## 2020-05-12 DIAGNOSIS — Z03818 Encounter for observation for suspected exposure to other biological agents ruled out: Secondary | ICD-10-CM | POA: Diagnosis not present

## 2020-05-12 DIAGNOSIS — Z20822 Contact with and (suspected) exposure to covid-19: Secondary | ICD-10-CM | POA: Diagnosis not present

## 2020-05-24 DIAGNOSIS — Z03818 Encounter for observation for suspected exposure to other biological agents ruled out: Secondary | ICD-10-CM | POA: Diagnosis not present

## 2020-05-31 DIAGNOSIS — Z03818 Encounter for observation for suspected exposure to other biological agents ruled out: Secondary | ICD-10-CM | POA: Diagnosis not present

## 2020-06-06 DIAGNOSIS — Z03818 Encounter for observation for suspected exposure to other biological agents ruled out: Secondary | ICD-10-CM | POA: Diagnosis not present

## 2020-06-12 DIAGNOSIS — D224 Melanocytic nevi of scalp and neck: Secondary | ICD-10-CM | POA: Diagnosis not present

## 2020-06-12 DIAGNOSIS — D2262 Melanocytic nevi of left upper limb, including shoulder: Secondary | ICD-10-CM | POA: Diagnosis not present

## 2020-06-12 DIAGNOSIS — L2089 Other atopic dermatitis: Secondary | ICD-10-CM | POA: Diagnosis not present

## 2020-06-13 DIAGNOSIS — Z03818 Encounter for observation for suspected exposure to other biological agents ruled out: Secondary | ICD-10-CM | POA: Diagnosis not present

## 2020-06-19 DIAGNOSIS — Z13220 Encounter for screening for lipoid disorders: Secondary | ICD-10-CM | POA: Diagnosis not present

## 2020-06-19 DIAGNOSIS — Z0000 Encounter for general adult medical examination without abnormal findings: Secondary | ICD-10-CM | POA: Diagnosis not present

## 2020-06-20 DIAGNOSIS — Z03818 Encounter for observation for suspected exposure to other biological agents ruled out: Secondary | ICD-10-CM | POA: Diagnosis not present

## 2020-06-27 DIAGNOSIS — Z03818 Encounter for observation for suspected exposure to other biological agents ruled out: Secondary | ICD-10-CM | POA: Diagnosis not present

## 2020-07-04 DIAGNOSIS — Z03818 Encounter for observation for suspected exposure to other biological agents ruled out: Secondary | ICD-10-CM | POA: Diagnosis not present

## 2020-07-06 ENCOUNTER — Other Ambulatory Visit: Payer: Self-pay | Admitting: Obstetrics & Gynecology

## 2020-07-07 ENCOUNTER — Other Ambulatory Visit: Payer: Self-pay | Admitting: Obstetrics & Gynecology

## 2020-07-11 DIAGNOSIS — Z03818 Encounter for observation for suspected exposure to other biological agents ruled out: Secondary | ICD-10-CM | POA: Diagnosis not present

## 2020-07-18 DIAGNOSIS — Z03818 Encounter for observation for suspected exposure to other biological agents ruled out: Secondary | ICD-10-CM | POA: Diagnosis not present

## 2020-07-25 DIAGNOSIS — Z03818 Encounter for observation for suspected exposure to other biological agents ruled out: Secondary | ICD-10-CM | POA: Diagnosis not present

## 2020-08-01 DIAGNOSIS — Z03818 Encounter for observation for suspected exposure to other biological agents ruled out: Secondary | ICD-10-CM | POA: Diagnosis not present

## 2020-08-08 DIAGNOSIS — Z03818 Encounter for observation for suspected exposure to other biological agents ruled out: Secondary | ICD-10-CM | POA: Diagnosis not present

## 2020-08-29 DIAGNOSIS — Z03818 Encounter for observation for suspected exposure to other biological agents ruled out: Secondary | ICD-10-CM | POA: Diagnosis not present

## 2020-09-19 ENCOUNTER — Other Ambulatory Visit: Payer: Self-pay | Admitting: Obstetrics & Gynecology

## 2020-09-19 NOTE — Telephone Encounter (Signed)
CE scheduled 10/10/20 and we r/s her once.

## 2020-10-02 ENCOUNTER — Encounter: Payer: BC Managed Care – PPO | Admitting: Obstetrics & Gynecology

## 2020-10-25 ENCOUNTER — Other Ambulatory Visit: Payer: Self-pay

## 2020-10-25 ENCOUNTER — Ambulatory Visit (INDEPENDENT_AMBULATORY_CARE_PROVIDER_SITE_OTHER): Payer: Commercial Managed Care - PPO | Admitting: Obstetrics & Gynecology

## 2020-10-25 ENCOUNTER — Encounter: Payer: Self-pay | Admitting: Obstetrics & Gynecology

## 2020-10-25 VITALS — BP 140/98 | HR 88 | Resp 14 | Ht 63.5 in | Wt 148.5 lb

## 2020-10-25 DIAGNOSIS — Z793 Long term (current) use of hormonal contraceptives: Secondary | ICD-10-CM | POA: Diagnosis not present

## 2020-10-25 DIAGNOSIS — Z01419 Encounter for gynecological examination (general) (routine) without abnormal findings: Secondary | ICD-10-CM | POA: Diagnosis not present

## 2020-10-25 DIAGNOSIS — N946 Dysmenorrhea, unspecified: Secondary | ICD-10-CM | POA: Diagnosis not present

## 2020-10-25 LAB — TSH: TSH: 2.86 mIU/L

## 2020-10-25 LAB — COMPREHENSIVE METABOLIC PANEL
AG Ratio: 2 (calc) (ref 1.0–2.5)
ALT: 14 U/L (ref 6–29)
AST: 14 U/L (ref 10–30)
Albumin: 4.7 g/dL (ref 3.6–5.1)
Alkaline phosphatase (APISO): 54 U/L (ref 31–125)
BUN: 14 mg/dL (ref 7–25)
CO2: 24 mmol/L (ref 20–32)
Calcium: 9.5 mg/dL (ref 8.6–10.2)
Chloride: 106 mmol/L (ref 98–110)
Creat: 0.85 mg/dL (ref 0.50–1.10)
Globulin: 2.3 g/dL (calc) (ref 1.9–3.7)
Glucose, Bld: 97 mg/dL (ref 65–99)
Potassium: 4 mmol/L (ref 3.5–5.3)
Sodium: 138 mmol/L (ref 135–146)
Total Bilirubin: 0.4 mg/dL (ref 0.2–1.2)
Total Protein: 7 g/dL (ref 6.1–8.1)

## 2020-10-25 LAB — LIPID PANEL
Cholesterol: 239 mg/dL — ABNORMAL HIGH (ref <200)
HDL: 43 mg/dL — ABNORMAL LOW (ref >=50)
LDL Cholesterol (Calc): 158 mg/dL (calc) — ABNORMAL HIGH
Non-HDL Cholesterol (Calc): 196 mg/dL (calc) — ABNORMAL HIGH (ref <130)
Total CHOL/HDL Ratio: 5.6 (calc) — ABNORMAL HIGH (ref <5.0)
Triglycerides: 213 mg/dL — ABNORMAL HIGH (ref <150)

## 2020-10-25 LAB — CBC
HCT: 39.5 % (ref 35.0–45.0)
Hemoglobin: 13.5 g/dL (ref 11.7–15.5)
MCH: 28.1 pg (ref 27.0–33.0)
MCHC: 34.2 g/dL (ref 32.0–36.0)
MCV: 82.3 fL (ref 80.0–100.0)
MPV: 9.1 fL (ref 7.5–12.5)
Platelets: 296 10*3/uL (ref 140–400)
RBC: 4.8 10*6/uL (ref 3.80–5.10)
RDW: 12.4 % (ref 11.0–15.0)
WBC: 4.7 10*3/uL (ref 3.8–10.8)

## 2020-10-25 MED ORDER — NORETHIN ACE-ETH ESTRAD-FE 1-20 MG-MCG PO TABS: 1.0000 | ORAL_TABLET | Freq: Every day | ORAL | 4 refills | Status: DC

## 2020-10-25 NOTE — Progress Notes (Signed)
    Victoria Decker [birth year] [id number]   History:    25 y.o. G0 Single/Virgin.  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  RP:  Established patient presenting for annual gyn exam   HPI: Well on continuous norethindrone ethinyl estradiol 1/35.  No breakthrough bleeding.  No pelvic pain.  [place].  Urine and bowel movements normal.  Breasts normal.  Body mass index 25.89. Good fitness.  Healthy nutrition.  H/O high Cholesterol.  Fasting labs here today.   Past medical history,surgical history, family history and social history were all reviewed and documented in the EPIC chart.  Gynecologic History No LMP recorded. (Menstrual status: Oral contraceptives).  Obstetric History OB History  Gravida Para Term Preterm AB Living  0 0 0 0 0 0  SAB IAB Ectopic Multiple Live Births  0 0 0 0 0     ROS: A ROS was performed and pertinent positives and negatives are included in the history.  GENERAL: No fevers or chills. HEENT: No change in vision, no earache, sore throat or sinus congestion. NECK: No pain or stiffness. CARDIOVASCULAR: No chest pain or pressure. No palpitations. PULMONARY: No shortness of breath, cough or wheeze. GASTROINTESTINAL: No abdominal pain, nausea, vomiting or diarrhea, melena or bright red blood per rectum. GENITOURINARY: No urinary frequency, urgency, hesitancy or dysuria. MUSCULOSKELETAL: No joint or muscle pain, no back pain, no recent trauma. DERMATOLOGIC: No rash, no itching, no lesions. ENDOCRINE: No polyuria, polydipsia, no heat or cold intolerance. No recent change in weight. HEMATOLOGICAL: No anemia or easy bruising or bleeding. NEUROLOGIC: No headache, seizures, numbness, tingling or weakness. PSYCHIATRIC: No depression, no loss of interest in normal activity or change in sleep pattern.     Exam:   BP (!) 140/98 (BP Location: Right Arm, Patient Position: Sitting, Cuff Size: Normal)   Pulse 88   Resp 14   Ht 5' 3.5" (1.613 m)   Wt 148 lb 8 oz (67.4 kg)   BMI  25.89 kg/m   Body mass index is 25.89 kg/m.  General appearance : Well developed well nourished female. No acute distress HEENT: Eyes: no retinal hemorrhage or exudates,  Neck supple, trachea midline, no carotid bruits, no thyroidmegaly Lungs: Clear to auscultation, no rhonchi or wheezes, or rib retractions  Heart: Regular rate and rhythm, no murmurs or gallops Breast:Examined in sitting and supine position were symmetrical in appearance, no palpable masses or tenderness,  no skin retraction, no nipple inversion, no nipple discharge, no skin discoloration, no axillary or supraclavicular lymphadenopathy Abdomen: no palpable masses or tenderness, no rebound or guarding Extremities: no edema or skin discoloration or tenderness  Pelvic: Virgin, gyn exam deferred   Assessment/Plan:  25 y.o. female for annual exam   1. Well female exam with routine gynecological exam General exam with breast exam normal. Virgin, will start gynecologic exam with Pap test when sexually active. H/O high Cholesterol.  Fasting labs here today. - CBC - Comp Met (CMET) - Lipid Profile - TSH  2. Dysmenorrhea treated with oral contraceptive Changed BCPs to a lower Estradiol dosage because of nausea. Usage reviewed and prescription sent to pharmacy.  Other orders - triamcinolone ointment (KENALOG) 0.1 %; PLEASE SEE ATTACHED FOR DETAILED DIRECTIONS (Patient not taking: Reported on 10/25/2020) - norethindrone-ethinyl estradiol (LOESTRIN FE) 1-20 MG-MCG tablet; Take 1 tablet by mouth daily.  Princess Bruins MD, 9:41 AM 10/25/2020

## 2020-10-28 ENCOUNTER — Encounter: Payer: Self-pay | Admitting: Obstetrics & Gynecology

## 2020-10-29 ENCOUNTER — Encounter: Payer: Self-pay | Admitting: Cardiovascular Disease

## 2020-10-29 NOTE — Progress Notes (Signed)
Cardiology Office Note:    Date:  10/30/2020   ID:  Victoria Decker, DOB December 04, 1995, MRN [id number]  PCP:  Aliene Beams, MD  [facility] Cardiologist:  [facility] Electrophysiologist:  None   Referring MD: Maryellen Pile, MD   Chief Complaint  Patient presents with  . Hyperlipidemia    Jan. 31, 2022   Victoria Decker is a 25 y.o. female with a hx of hyperlipidemia.  We were asked to see her today by Dr Donnie Coffin for further evaluation of her hyperlipidemia.  Master in Surveyor, minerals at [employer] . Cher Nak[employer] for [employer] softball 6 days a week for [place]   Eats an unrestricted diet.  But not all that bad   No CP , no dyspnea   History reviewed. No pertinent past medical history.  Past Surgical History:  Procedure Laterality Date  . ARM SURGERY     BROKEN BONE    Current Medications: Current Meds  Medication Sig  . norethindrone-ethinyl estradiol (LOESTRIN FE) 1-20 MG-MCG tablet Take 1 tablet by mouth daily.  . norethindrone-ethinyl estradiol 1/35 (ALAYCEN 1/35) tablet Take 1 tablet by mouth See admin instructions.  . rosuvastatin (CRESTOR) 10 MG tablet Take 1 tablet (10 mg total) by mouth daily.  Marland Kitchen triamcinolone ointment (KENALOG) 0.1 %      Allergies:   Patient has no known allergies.   Social History   Socioeconomic History  . Marital status: Single    Spouse name: Not on file  . Number of children: Not on file  . Years of education: Not on file  . Highest education level: Not on file  Occupational History  . Not on file  Tobacco Use  . Smoking status: Never Smoker  . Smokeless tobacco: Never Used  Vaping Use  . Vaping Use: Never used  Substance and Sexual Activity  . Alcohol use: No    Alcohol/week: 0.0 standard drinks  . Drug use: Not on file  . Sexual activity: Never  Other Topics Concern  . Not on file  Social History Narrative  . Not on file   Social Determinants of Health   Financial Resource Strain: Not on file   Food Insecurity: Not on file  Transportation Needs: Not on file  Physical Activity: Not on file  Stress: Not on file  Social Connections: Not on file     Family History: The patient's family history includes Breast cancer in her paternal aunt; Cancer in her maternal uncle and paternal aunt; Diabetes in her maternal grandfather and maternal grandmother; Heart disease in her maternal grandmother, paternal grandfather, and paternal uncle.  ROS:   Please see the history of present illness.     All other systems reviewed and are negative.  EKGs/Labs/Other Studies Reviewed:    The following studies were reviewed today:   EKG:   Jan. 31, 2022:  NSR at 85.  No ST or T wave changes.   Recent Labs: 10/25/2020: ALT 14; BUN 14; Creat 0.85; Hemoglobin 13.5; Platelets 296; Potassium 4.0; Sodium 138; TSH 2.86  Recent Lipid Panel    Component Value Date/Time   CHOL 239 (H) 10/25/2020 1006   TRIG 213 (H) 10/25/2020 1006   HDL 43 (L) 10/25/2020 1006   CHOLHDL 5.6 (H) 10/25/2020 1006   LDLCALC 158 (H) 10/25/2020 1006     Risk Assessment/Calculations:       Physical Exam:    VS:  BP 122/76   Pulse 85   Ht 5'  3.5" (1.613 m)   Wt 152 lb 3.2 oz (69 kg)   SpO2 97%   BMI 26.54 kg/m     Wt Readings from Last 3 Encounters:  10/30/20 152 lb 3.2 oz (69 kg)  10/25/20 148 lb 8 oz (67.4 kg)  04/26/19 133 lb (60.3 kg)     GEN: \ Well nourished, well developed in no acute distress HEENT: Normal NECK: No JVD; No carotid bruits LYMPHATICS: No lymphadenopathy CARDIAC: RRR, no murmurs, rubs, gallops RESPIRATORY:  Clear to auscultation without rales, wheezing or rhonchi  ABDOMEN: Soft, non-tender, non-distended MUSCULOSKELETAL:  No edema; No deformity  SKIN: Warm and dry NEUROLOGIC:  Alert and oriented x 3 PSYCHIATRIC:  Normal affect   ASSESSMENT:    1. Hyperlipidemia, mixed    PLAN:    In order of problems listed above:  1. Hyperlipidemia: Victoria Decker  presents for further evaluation  of hyperlipidemia.  She has a very strong family history of coronary artery disease.  I see her father, Ferne Reus, as a patient.  We will start her on rosuvastatin 10 mg a day.  We will see her back in 3 months for an NMR lipid profile, ALT, basic metabolic profile.  Ive asked her to work on her diet a bit.   Start exercising regularly .    I will see her in a year.         Medication Adjustments/Labs and Tests Ordered: Current medicines are reviewed at length with the patient today.  Concerns regarding medicines are outlined above.  Orders Placed This Encounter  Procedures  . Basic Metabolic Panel (BMET)  . ALT  . NMR LipoProf + Graph  . EKG 12-Lead   Meds ordered this encounter  Medications  . rosuvastatin (CRESTOR) 10 MG tablet    Sig: Take 1 tablet (10 mg total) by mouth daily.    Dispense:  90 tablet    Refill:  3    Patient Instructions  Medication Instructions:  Your physician has recommended you make the following change in your medication: START ROSUVASTATIN (CRESTOR) 10MG  DAILY. *If you need a refill on your cardiac medications before your next appointment, please call your pharmacy*   Lab Work: Your physician recommends that you return for lab work in: 3 months (Cholesterol panel, ALT, and Basic Metabolic Panel.  You will need to be fasting (no solid food after midnight, only water and black coffee.)  If you have labs (blood work) drawn today and your tests are completely normal, you will receive your results only by: MyChart Message (if you have MyChart) OR . A paper copy in the mail If you have any lab test that is abnormal or we need to change your treatment, we will call you to review the results.   Testing/Procedures: None ordered    Follow-Up: At [facility] you and your health needs are our priority.  As part of our continuing mission to provide you with exceptional heart care, we have created designated Provider Care Teams.  These Care Teams include  your primary Cardiologist (physician) and Advanced Practice Providers (APPs -  Physician Assistants and Nurse Practitioners) who all work together to provide you with the care you need, when you need it.   Your next appointment:   1 year(s)  The format for your next appointment:   In Person  Provider:   You may see Dr. [facility] or one of the following Advanced Practice Providers on your designated Care Team:    Jannette Spanner,  PA-C  Chelsea Aus, PA-C        Signed, Kristeen Miss, MD  10/30/2020 3:45 PM    [facility]

## 2020-10-30 ENCOUNTER — Encounter: Payer: Self-pay | Admitting: Cardiovascular Disease

## 2020-10-30 ENCOUNTER — Other Ambulatory Visit: Payer: Self-pay

## 2020-10-30 ENCOUNTER — Ambulatory Visit (INDEPENDENT_AMBULATORY_CARE_PROVIDER_SITE_OTHER): Payer: Commercial Managed Care - PPO | Admitting: Cardiovascular Disease

## 2020-10-30 VITALS — BP 122/76 | HR 85 | Ht 63.5 in | Wt 152.2 lb

## 2020-10-30 DIAGNOSIS — E782 Mixed hyperlipidemia: Secondary | ICD-10-CM

## 2020-10-30 MED ORDER — ROSUVASTATIN CALCIUM 10 MG PO TABS: 10.0000 mg | ORAL_TABLET | Freq: Every day | ORAL | 3 refills | Status: DC

## 2020-10-30 NOTE — Patient Instructions (Signed)
Medication Instructions:  Your physician has recommended you make the following change in your medication: START ROSUVASTATIN (CRESTOR) 10MG  DAILY. *If you need a refill on your cardiac medications before your next appointment, please call your pharmacy*   Lab Work: Your physician recommends that you return for lab work in: 3 months (Cholesterol panel, ALT, and Basic Metabolic Panel.  You will need to be fasting (no solid food after midnight, only water and black coffee.)  If you have labs (blood work) drawn today and your tests are completely normal, you will receive your results only by: MyChart Message (if you have MyChart) OR . A paper copy in the mail If you have any lab test that is abnormal or we need to change your treatment, we will call you to review the results.   Testing/Procedures: None ordered    Follow-Up: At [facility], you and your health needs are our priority.  As part of our continuing mission to provide you with exceptional heart care, we have created designated Provider Care Teams.  These Care Teams include your primary Cardiologist (physician) and Advanced Practice Providers (APPs -  Physician Assistants and Nurse Practitioners) who all work together to provide you with the care you need, when you need it.   Your next appointment:   1 year(s)  The format for your next appointment:   In Person  Provider:   You may see Dr. [facility] or one of the following Advanced Practice Providers on your designated Care Team:    Jannette Spanner, PA-C  Vin Greenwood, [place]

## 2020-12-10 ENCOUNTER — Other Ambulatory Visit: Payer: Self-pay | Admitting: Obstetrics & Gynecology

## 2021-01-22 ENCOUNTER — Other Ambulatory Visit: Payer: Self-pay

## 2021-01-22 ENCOUNTER — Other Ambulatory Visit: Payer: Commercial Managed Care - PPO | Admitting: *Deleted

## 2021-01-22 DIAGNOSIS — E782 Mixed hyperlipidemia: Secondary | ICD-10-CM

## 2021-01-22 LAB — BASIC METABOLIC PANEL
Creatinine, Ser: 0.83 mg/dL (ref 0.57–1.00)
Sodium: 141 mmol/L (ref 134–144)
eGFR: 102 mL/min/{1.73_m2} (ref >59)

## 2021-01-23 LAB — NMR LIPOPROF + GRAPH
Cholesterol, Total: 180 mg/dL (ref 100–199)
HDL Particle Number: 27.6 umol/L — ABNORMAL LOW (ref >=30.5)
HDL-C: 31 mg/dL — ABNORMAL LOW (ref >39)
LDL Particle Number: 1689 nmol/L — ABNORMAL HIGH (ref <1000)
LDL Size: 20.2 nm — ABNORMAL LOW (ref >20.5)
LDL-C (NIH Calc): 121 mg/dL — ABNORMAL HIGH (ref 0–99)
LP-IR Score: 70 — ABNORMAL HIGH (ref <=45)
Small LDL Particle Number: 1176 nmol/L — ABNORMAL HIGH (ref <=527)
Triglycerides: 158 mg/dL — ABNORMAL HIGH (ref 0–149)

## 2021-01-23 LAB — BASIC METABOLIC PANEL
BUN/Creatinine Ratio: 13 (ref 9–23)
BUN: 11 mg/dL (ref 6–20)
CO2: 23 mmol/L (ref 20–29)
Calcium: 9.1 mg/dL (ref 8.7–10.2)
Chloride: 105 mmol/L (ref 96–106)
Glucose: 85 mg/dL (ref 65–99)
Potassium: 4.4 mmol/L (ref 3.5–5.2)

## 2021-01-23 LAB — ALT: ALT: 38 IU/L — ABNORMAL HIGH (ref 0–32)

## 2021-01-25 ENCOUNTER — Telehealth: Payer: Self-pay

## 2021-01-25 DIAGNOSIS — E782 Mixed hyperlipidemia: Secondary | ICD-10-CM

## 2021-01-25 NOTE — Telephone Encounter (Signed)
-----   Message from Vesta Mixer, MD sent at 01/23/2021  5:34 PM EDT ----- Lipids remain elevated on Rosuvastatin .   Her ALT increased to 38.   This is not exceptionally high but is 2-3 x higher than her normal level. Lets refer her to lipid clinic for further evaluation and consideration of PCSK9 inhibitor She has a strong family hx of premature CAD

## 2021-01-25 NOTE — Telephone Encounter (Signed)
RN left message (DPR) updating patient of lipid clinic referral. Patient viewed results and comments on mychart. RN instructed patient to contact the office with any questions or concerns. Referral entered.

## 2021-02-14 ENCOUNTER — Encounter (HOSPITAL_BASED_OUTPATIENT_CLINIC_OR_DEPARTMENT_OTHER): Payer: Self-pay | Admitting: *Deleted

## 2021-02-14 ENCOUNTER — Other Ambulatory Visit: Payer: Self-pay

## 2021-02-14 DIAGNOSIS — Z203 Contact with and (suspected) exposure to rabies: Secondary | ICD-10-CM | POA: Insufficient documentation

## 2021-02-14 DIAGNOSIS — Z2914 Encounter for prophylactic rabies immune globin: Secondary | ICD-10-CM | POA: Insufficient documentation

## 2021-02-14 DIAGNOSIS — Z23 Encounter for immunization: Secondary | ICD-10-CM | POA: Diagnosis not present

## 2021-02-14 NOTE — ED Triage Notes (Signed)
Here for rabies injections for + rabies exposure

## 2021-02-15 ENCOUNTER — Emergency Department (HOSPITAL_BASED_OUTPATIENT_CLINIC_OR_DEPARTMENT_OTHER)
Admission: EM | Admit: 2021-02-15 | Discharge: 2021-02-15 | Disposition: A | Discharge status: Home / Self Care | Payer: Commercial Managed Care - PPO | Attending: Emergency Medicine | Admitting: Emergency Medicine

## 2021-02-15 DIAGNOSIS — Z203 Contact with and (suspected) exposure to rabies: Secondary | ICD-10-CM

## 2021-02-15 MED ORDER — RABIES IMMUNE GLOBULIN 150 UNIT/ML IM INJ
20.0000 [IU]/kg | INJECTION | Freq: Once | INTRAMUSCULAR | Status: AC
  Administered 2021-02-15: 1350 [IU] via INTRAMUSCULAR
  Filled 2021-02-15: qty 10

## 2021-02-15 MED ORDER — RABIES VACCINE, PCEC IM SUSR
1.0000 mL | Freq: Once | INTRAMUSCULAR | Status: AC
  Administered 2021-02-15: 1 mL via INTRAMUSCULAR
  Filled 2021-02-15: qty 1

## 2021-02-15 NOTE — Discharge Instructions (Addendum)
You were evaluated in the Emergency Department and after careful evaluation, we did not find any emergent condition requiring admission or further testing in the hospital.  Your exam/testing today is overall reassuring.  You received the rabies vaccine and immunoglobulin today.  Today is day 1.  Please return to either the emergency department or health department for repeat vaccine doses on day 3, day 7, and day 14.  Please return to the Emergency Department if you experience any worsening of your condition.   Thank you for allowing [place] to be a part of your care.

## 2021-02-15 NOTE — ED Provider Notes (Signed)
[facility]-EMERGENCY DEPT [facility] Emergency Department Provider Note MRN:  [id number]  Arrival date & time: 02/15/21     Chief Complaint   Rabies Injection   History of Present Illness   Victoria Decker is a 25 y.o. year-old female with no cardiovascular risk presenting to the ED with chief complaint of rabies injection.  Family dog killed a fox that was tested positive for rabies.  Here for rabies vaccine, sent by health department.  No injuries, no complaints, touched the dog/dogs toy.  Review of Systems  A complete 10 system review of systems was obtained and all systems are negative except as noted in the HPI and PMH.   Patient's Health History   History reviewed. No pertinent past medical history.  Past Surgical History:  Procedure Laterality Date  . ARM SURGERY     BROKEN BONE    Family History  Problem Relation Age of Onset  . Cancer Maternal Uncle        LUNG  . Cancer Paternal Aunt        BRAIN  . Breast cancer Paternal Aunt   . Heart disease Paternal Uncle   . Diabetes Maternal Grandmother   . Heart disease Maternal Grandmother   . Diabetes Maternal Grandfather   . Heart disease Paternal Grandfather     Social History   Socioeconomic History  . Marital status: Single    Spouse name: Not on file  . Number of children: Not on file  . Years of education: Not on file  . Highest education level: Not on file  Occupational History  . Not on file  Tobacco Use  . Smoking status: Never Smoker  . Smokeless tobacco: Never Used  Vaping Use  . Vaping Use: Never used  Substance and Sexual Activity  . Alcohol use: No    Alcohol/week: 0.0 standard drinks  . Drug use: Not on file  . Sexual activity: Never  Other Topics Concern  . Not on file  Social History Narrative  . Not on file   Social Determinants of Health   Financial Resource Strain: Not on file  Food Insecurity: Not on file  Transportation Needs: Not on file  Physical Activity: Not on file   Stress: Not on file  Social Connections: Not on file  Intimate Partner Violence: Not on file     Physical Exam   Vitals:   02/14/21 2237  BP: (!) 141/97  Pulse: 77  Resp: 18  Temp: 98.6 F (37 C)  SpO2: 100%    CONSTITUTIONAL: Well-appearing, NAD NEURO:  Alert and oriented x 3, no focal deficits EYES:  eyes equal and reactive ENT/NECK:  no LAD, no JVD CARDIO: Regular rate, well-perfused, normal S1 and S2 PULM:  CTAB no wheezing or rhonchi GI/GU:  normal bowel sounds, non-distended, non-tender MSK/SPINE:  No gross deformities, no edema SKIN:  no rash, atraumatic PSYCH:  Appropriate speech and behavior  *Additional and/or pertinent findings included in MDM below  Diagnostic and Interventional Summary    EKG Interpretation  Date/Time:    Ventricular Rate:    PR Interval:    QRS Duration:   QT Interval:    QTC Calculation:   R Axis:     Text Interpretation:        Labs Reviewed - No data to display  No orders to display    Medications  rabies immune globulin (HYPERAB/KEDRAB) injection 1,350 Units (1,350 Units Intramuscular Given 02/15/21 0139)  rabies vaccine (RABAVERT) injection 1 mL (  1 mL Intramuscular Given 02/15/21 0136)     Procedures  /  Critical Care Procedures  ED Course and Medical Decision Making  I have reviewed the triage vital signs, the nursing notes, and pertinent available records from the EMR.  Listed above are laboratory and imaging tests that I personally ordered, reviewed, and interpreted and then considered in my medical decision making (see below for details).  Immunoglobulin and rabies vaccine given, appropriate for discharge       [name], MD [facility] Emergency Medicine [facility] [email or web address]  Final Clinical Impressions(s) / ED Diagnoses     ICD-10-CM   1. Need for post exposure prophylaxis for rabies  Z20.3     ED Discharge Orders    None       Discharge Instructions Discussed with  and Provided to Patient:     Discharge Instructions     You were evaluated in the Emergency Department and after careful evaluation, we did not find any emergent condition requiring admission or further testing in the hospital.  Your exam/testing today is overall reassuring.  You received the rabies vaccine and immunoglobulin today.  Today is day 1.  Please return to either the emergency department or health department for repeat vaccine doses on day 3, day 7, and day 14.  Please return to the Emergency Department if you experience any worsening of your condition.   Thank you for allowing [place] to be a part of your care.      [name], MD 02/15/21 [id number]

## 2021-02-18 ENCOUNTER — Encounter: Payer: Self-pay | Admitting: Emergency Medicine

## 2021-02-18 ENCOUNTER — Other Ambulatory Visit: Payer: Self-pay

## 2021-02-18 ENCOUNTER — Emergency Department (INDEPENDENT_AMBULATORY_CARE_PROVIDER_SITE_OTHER)
Admission: EM | Admit: 2021-02-18 | Discharge: 2021-02-18 | Disposition: A | Discharge status: Home / Self Care | Payer: Commercial Managed Care - PPO | Source: Home / Self Care

## 2021-02-18 DIAGNOSIS — Z203 Contact with and (suspected) exposure to rabies: Secondary | ICD-10-CM

## 2021-02-18 HISTORY — DX: Hyperlipidemia, unspecified: E78.5

## 2021-02-18 MED ORDER — RABIES VACCINE, PCEC IM SUSR
1.0000 mL | Freq: Once | INTRAMUSCULAR | Status: AC
  Administered 2021-02-18: 1 mL via INTRAMUSCULAR

## 2021-02-18 NOTE — ED Triage Notes (Signed)
Patient here for 2nd follow up rabies vaccination; exposure on 02/12/21. States no adverse rxn to last vaccination.

## 2021-02-19 ENCOUNTER — Ambulatory Visit (INDEPENDENT_AMBULATORY_CARE_PROVIDER_SITE_OTHER): Payer: Commercial Managed Care - PPO | Admitting: Pharmacist

## 2021-02-19 DIAGNOSIS — E782 Mixed hyperlipidemia: Secondary | ICD-10-CM

## 2021-02-19 NOTE — Progress Notes (Signed)
Patient ID: Victoria Decker                 DOB: 1995-11-12                    MRN: [id number]     HPI: Ilma B Kallstrom is a 25 y.o. female patient referred to lipid clinic by Dr Elease Hashimoto. PMH is significant for hyperlipidemia and strong family history of CAD. She was seen by Dr Elease Hashimoto in January 2022 after referral from both PCP Dr Tracie Harrier and OB-GYN Dr Seymour Bars for her hyperlipidemia. She was started on rosuvastatin 10mg  daily but reported leg pain within 2 weeks of starting rosuvastatin.  Pt presents today with her mother. Her father is a patient of Dr who had an MI at 76 and now takes rosuvastatin and ezetimibe. Had myalgias on atorvastatin, CoQ10 helps him tolerate his statin. Very strong family history of heart disease. Maternal grandmother died at 63 from an MI, maternal grandfather died at 34 also from heart disease. Paternal uncle also had MI in his mid 7s. Maternal grandmother with MI and stroke in her 40s. Her mother reports she had lipids checked when she was 3 and her numbers were elevated, saw a dietician at that time.  She stopped taking rosuvastatin the day she sent in MyChart message about cramping in her legs - she was in the middle of softball season. Tried Mg supplementation which did not help. She restarted rosuvastatin 10mg  daily some time in April, not sure how far before her labs were rechecked. Has had occasional aching but overall better than when she took rosuvastatin before (softball season is also over though). Was sick when she had lipids checked last time, was on abx and Tylenol that could have increased her LFTs.  Current Medications: rosuvastatin 10mg  daily - restarted last month Intolerances: rosuvastatin 10mg  daily - cramping in legs Risk Factors: fam hx of CAD, elevated LDL-P  LDL goal: 100mg /dL  Diet: cut back on fast food, does eat some red meat  Exercise: Plays softball 6 days a week  Family History: Breast cancer in her paternal aunt; Cancer in her  maternal uncle and paternal aunt; Diabetes in her maternal grandfather and maternal grandmother; Heart disease in her maternal grandmother, paternal grandfather, and paternal uncle.  Social History: Denies tobacco, alcohol, and drug use  Labs: 10/25/20: TC 239, HDL 43, TG 213, LDL 158, AST 14, ALT 14 (no lipid lowering therapy) 01/22/21: TC 180, HDL 31, TG 158, LDL 121, LDL-P 1689, ALT 38, LP-IR 70  Past Medical History:  Diagnosis Date  . Hyperlipidemia     Current Outpatient Medications on File Prior to Visit  Medication Sig Dispense Refill  . ALAYCEN 1/35 tablet TAKE 1 TABLET BY MOUTH DAILY. CONTINUOUS USE RECOMMENDED FOR DYSMENORRHEA/PMS. 112 tablet 4  . norethindrone-ethinyl estradiol (LOESTRIN FE) 1-20 MG-MCG tablet Take 1 tablet by mouth daily. 84 tablet 4  . rosuvastatin (CRESTOR) 10 MG tablet Take 1 tablet (10 mg total) by mouth daily. 90 tablet 3  . triamcinolone ointment (KENALOG) 0.1 %      No current facility-administered medications on file prior to visit.    No Known Allergies  Assessment/Plan:  1. Hyperlipidemia - LDL improved from baseline 158 to 121 on rosuvastatin 10mg , pt not sure how soon before her labs she had resumed rosuvastatin. LDL goal < 100 given strong family history of premature CAD. LDL-P also elevated at 1689 above goal < 1000. Pt advised to try CoQ10  100-200mg  daily since this has helped her father tolerate rosuvastatin. She will try to continue on rosuvastatin 10mg  daily but if myalgias become bothersome or worsen, she can decrease her dose to 5mg  daily or try 10mg  every other day. Will recheck advanced lipid panel in 2 months once she has been on rosuvastatin for longer to assess full efficacy of therapy. Will also add on Lp(a), ApoB, and A1c (since LP-IR was elevated on last panel suggesting insulin resistance). Pt encouraged to continue with physical activity and to eat a heart healthy diet. Will consider adding on ezetimibe if needed; insurance will not  cover PCSK9i currently for primary prevention without maxing out other oral options first. Pt also aware to stop rosuvastatin if she becomes pregnant.  Senaida Chilcote E. Copper Kirtley, PharmD, BCACP, CPP [facility] [address], [place], [address] Phone: [phone]; Fax: [phone] 02/19/2021 3:19 PM

## 2021-02-19 NOTE — Patient Instructions (Addendum)
Your LDL cholesterol is 158 at baseline and improved to 121 on your labs from last month. Your goal is <100  Your LDL particle number is 1689 and your goal is < 1000  Try to take rosuvastatin 10mg  daily - you can add on CoQ10 100mg  daily to see if this helps with the cramping in your legs. If not, you can try decreasing your rosuvastatin to 5mg  daily or 10mg  every other day  We'll recheck fasting cholesterol on Monday, July 11th any time after 7:30am

## 2021-02-22 ENCOUNTER — Encounter: Payer: Self-pay | Admitting: Emergency Medicine

## 2021-02-22 ENCOUNTER — Other Ambulatory Visit: Payer: Self-pay

## 2021-02-22 ENCOUNTER — Emergency Department (INDEPENDENT_AMBULATORY_CARE_PROVIDER_SITE_OTHER)
Admission: EM | Admit: 2021-02-22 | Discharge: 2021-02-22 | Disposition: A | Discharge status: Home / Self Care | Payer: Commercial Managed Care - PPO | Source: Home / Self Care

## 2021-02-22 DIAGNOSIS — Z299 Encounter for prophylactic measures, unspecified: Secondary | ICD-10-CM

## 2021-02-22 DIAGNOSIS — Z23 Encounter for immunization: Secondary | ICD-10-CM

## 2021-02-22 DIAGNOSIS — Z203 Contact with and (suspected) exposure to rabies: Secondary | ICD-10-CM

## 2021-02-22 MED ORDER — RABIES VACCINE, PCEC IM SUSR
1.0000 mL | Freq: Once | INTRAMUSCULAR | Status: AC
  Administered 2021-02-22: 1 mL via INTRAMUSCULAR

## 2021-02-22 NOTE — ED Triage Notes (Signed)
Third Rabies injection

## 2021-03-01 ENCOUNTER — Other Ambulatory Visit: Payer: Self-pay

## 2021-03-01 ENCOUNTER — Ambulatory Visit (HOSPITAL_COMMUNITY)
Admission: EM | Admit: 2021-03-01 | Discharge: 2021-03-01 | Disposition: A | Discharge status: Home / Self Care | Payer: Commercial Managed Care - PPO | Attending: Internal Medicine | Admitting: Internal Medicine

## 2021-03-01 DIAGNOSIS — Z203 Contact with and (suspected) exposure to rabies: Secondary | ICD-10-CM

## 2021-03-01 MED ORDER — RABIES VACCINE, PCEC IM SUSR
INTRAMUSCULAR | Status: AC
  Filled 2021-03-01: qty 1

## 2021-03-01 MED ORDER — RABIES VACCINE, PCEC IM SUSR
1.0000 mL | Freq: Once | INTRAMUSCULAR | Status: AC
  Administered 2021-03-01: 1 mL via INTRAMUSCULAR

## 2021-04-09 ENCOUNTER — Other Ambulatory Visit: Payer: Self-pay

## 2021-04-09 ENCOUNTER — Other Ambulatory Visit: Payer: Commercial Managed Care - PPO

## 2021-04-09 DIAGNOSIS — E782 Mixed hyperlipidemia: Secondary | ICD-10-CM

## 2021-04-10 LAB — LIPOPROTEIN A (LPA): Lipoprotein (a): 32.7 nmol/L (ref <75.0)

## 2021-04-10 LAB — NMR, LIPOPROFILE
Cholesterol, Total: 151 mg/dL (ref 100–199)
HDL Particle Number: 37.6 umol/L (ref >=30.5)
HDL-C: 43 mg/dL (ref >39)
LDL Particle Number: 1182 nmol/L — ABNORMAL HIGH (ref <1000)
LDL Size: 19.8 nm — ABNORMAL LOW (ref >20.5)
LDL-C (NIH Calc): 77 mg/dL (ref 0–99)
LP-IR Score: 83 — ABNORMAL HIGH (ref <=45)
Small LDL Particle Number: 853 nmol/L — ABNORMAL HIGH (ref <=527)
Triglycerides: 180 mg/dL — ABNORMAL HIGH (ref 0–149)

## 2021-04-10 LAB — HEMOGLOBIN A1C
Est. average glucose Bld gHb Est-mCnc: 108 mg/dL
Hgb A1c MFr Bld: 5.4 % (ref 4.8–5.6)

## 2021-04-10 LAB — HEPATIC FUNCTION PANEL
ALT: 30 IU/L (ref 0–32)
AST: 17 IU/L (ref 0–40)
Albumin: 4.7 g/dL (ref 3.9–5.0)
Alkaline Phosphatase: 63 IU/L (ref 44–121)
Bilirubin Total: 0.2 mg/dL (ref 0.0–1.2)
Bilirubin, Direct: <0.1 mg/dL (ref 0.00–0.40)
Total Protein: 7 g/dL (ref 6.0–8.5)

## 2021-04-10 LAB — APOLIPOPROTEIN B: Apolipoprotein B: 77 mg/dL (ref <90)

## 2021-04-13 ENCOUNTER — Telehealth: Payer: Self-pay | Admitting: Pharmacist

## 2021-04-13 DIAGNOSIS — E782 Mixed hyperlipidemia: Secondary | ICD-10-CM

## 2021-04-13 MED ORDER — EZETIMIBE 10 MG PO TABS: 10.0000 mg | ORAL_TABLET | Freq: Every day | ORAL | 3 refills | Status: DC

## 2021-04-13 NOTE — Telephone Encounter (Signed)
NMR lipid panel reflects pt on rosuvastatin 10mg  daily - started due to fam hx of premature CAD. LFTs have normalized on therapy, A1c is normal (checked due to elevated LP-IR score on her NMR panels that's a marker of insulin resistance). Would recommend keeping a closer eye on A1c as well with annual checks due to continued elevated LP-IR score though. ApoB and Lp(a) both normal which is encouraging. Her LDL has decreased to 77 (baseline 158) on rosuvastatin 10mg  daily which is excellent and now at goal < 100. Previously experienced cramping on this dose during softball season particularly so I am not inclined to increase her statin dose. TG still slightly elevated at 180 but would not treat with meds at this time - encourage moderation of carb/sugar intake. LDL particle # has improved but there's still a discordance between this and her LDL. Ideally prefer 10:1 ratio, hers is 15:1 which indicates that her LDL is more atherogenic. LDL particle # goal is < 1000.   Discussed with Dr who prefers pt to add ezetimibe 10mg  daily. Pt is agreeable to this plan and will continue on her rosuvastatin 10mg  daily which she has been tolerating well in the off-season. Will recheck advanced lipid panel in another 3 months.

## 2021-05-22 ENCOUNTER — Telehealth: Payer: Self-pay | Admitting: Cardiovascular Disease

## 2021-05-24 NOTE — Telephone Encounter (Signed)
Please schedule patient with me to discuss non-Estrogen contraception.  Patient with HBP, needs to stop BCPs immediately.

## 2021-05-24 NOTE — Telephone Encounter (Signed)
Patient has already read Dr. Sharol Roussel reply to her. I sent a message to the appt desk pool to contact her to schedule a visit.

## 2021-05-24 NOTE — Telephone Encounter (Signed)
No additional notes at this point

## 2021-05-25 ENCOUNTER — Encounter: Payer: Self-pay | Admitting: Obstetrics & Gynecology

## 2021-05-25 ENCOUNTER — Other Ambulatory Visit: Payer: Self-pay

## 2021-05-25 ENCOUNTER — Ambulatory Visit (INDEPENDENT_AMBULATORY_CARE_PROVIDER_SITE_OTHER): Payer: Commercial Managed Care - PPO | Admitting: Obstetrics & Gynecology

## 2021-05-25 VITALS — BP 140/84 | HR 86 | Resp 16

## 2021-05-25 DIAGNOSIS — N946 Dysmenorrhea, unspecified: Secondary | ICD-10-CM

## 2021-05-25 DIAGNOSIS — I159 Secondary hypertension, unspecified: Secondary | ICD-10-CM | POA: Diagnosis not present

## 2021-05-25 DIAGNOSIS — E782 Mixed hyperlipidemia: Secondary | ICD-10-CM

## 2021-05-25 DIAGNOSIS — Z793 Long term (current) use of hormonal contraceptives: Secondary | ICD-10-CM | POA: Diagnosis not present

## 2021-05-25 MED ORDER — NORETHINDRONE 0.35 MG PO TABS: 1.0000 | ORAL_TABLET | Freq: Every day | ORAL | 4 refills | Status: AC

## 2021-05-25 NOTE — Progress Notes (Signed)
    Victoria Decker 17-Jul-1996 [id number]        25 y.o.  G0  Accompanied by mother  RP: Episodes of near fainting with HTN on BCPs  HPI: Started on continuous BCPs to avoid Dysmenorrhea.  Not currently sexually active.  Episodes of "not feeling well", near fainting while playing sports, which led to recent Dx of HTN.  Seen by Cardio who attributes the HBP to the BCPs.  Patient is on Crestor and Zetia to treat hypercholesterolemia.  Father had an MI at a young age.     OB History  Gravida Para Term Preterm AB Living  0 0 0 0 0 0  SAB IAB Ectopic Multiple Live Births  0 0 0 0 0    Past medical history,surgical history, problem list, medications, allergies, family history and social history were all reviewed and documented in the EPIC chart.   Directed ROS with pertinent positives and negatives documented in the history of present illness/assessment and plan.  Exam:  Vitals:   05/25/21 1528  Pulse: 86  Resp: 16  BP 140/84  General appearance:  Normal  Gynecologic exam: Deferred   Assessment/Plan:  25 y.o. G0P0000   1. Dysmenorrhea treated with oral contraceptive Severe dysmenorrhea with hypertension/Hyperlipidemia/strong CVD fam hx in father, making estrogen containing birth control pills contraindicated.  Stop the generic of Loestrin FE 1/20.  We will start on the progestin only birth control pill.  Usage, risks and benefits reviewed with patient.  Counseling on progestin only contraceptive was thoroughly covered.  Patient declined Nexplanon and progesterone IUD.  Depo-Provera not recommended given the higher dose of progestin.  2. Secondary hypertension Sporadic episodes of not feeling well with sensation of near fainting.  Will rule out pheochromocytoma with metanephrines and the plasma today.  Verify thyroid function with TSH.  Patient is followed by cardiology. - Metanephrines, plasma - TSH  3. Mixed hyperlipidemia On Crestor and Zetia.  Other orders - norethindrone  (MICRONOR) 0.35 MG tablet; Take 1 tablet (0.35 mg total) by mouth daily.   Genia Del MD, 3:55 PM 05/25/2021

## 2021-05-26 ENCOUNTER — Encounter: Payer: Self-pay | Admitting: Obstetrics & Gynecology

## 2021-05-31 LAB — METANEPHRINES, PLASMA
Metanephrine, Free: <25 pg/mL (ref <=57)
Normetanephrine, Free: 26 pg/mL (ref <=148)
Total Metanephrines-Plasma: 26 pg/mL (ref <=205)

## 2021-05-31 LAB — T4, FREE: Free T4: 1.4 ng/dL (ref 0.8–1.8)

## 2021-05-31 LAB — THYROID STIMULATING IMMUNOGLOBULIN: TSI: <89 % baseline (ref <140)

## 2021-05-31 LAB — TSH: TSH: 4.86 mIU/L — ABNORMAL HIGH

## 2021-05-31 LAB — T3, FREE: T3, Free: 3.7 pg/mL (ref 2.3–4.2)

## 2021-06-06 ENCOUNTER — Ambulatory Visit: Payer: Commercial Managed Care - PPO | Admitting: Obstetrics & Gynecology

## 2021-06-12 ENCOUNTER — Other Ambulatory Visit: Payer: Self-pay | Admitting: Family Medicine

## 2021-06-12 DIAGNOSIS — I10 Essential (primary) hypertension: Secondary | ICD-10-CM

## 2021-06-12 DIAGNOSIS — R1013 Epigastric pain: Secondary | ICD-10-CM

## 2021-06-18 ENCOUNTER — Other Ambulatory Visit: Payer: Commercial Managed Care - PPO | Admitting: *Deleted

## 2021-06-18 ENCOUNTER — Other Ambulatory Visit: Payer: Self-pay

## 2021-06-18 DIAGNOSIS — E782 Mixed hyperlipidemia: Secondary | ICD-10-CM

## 2021-06-19 LAB — NMR, LIPOPROFILE
Cholesterol, Total: 105 mg/dL (ref 100–199)
HDL Particle Number: 28 umol/L — ABNORMAL LOW (ref >=30.5)
HDL-C: 35 mg/dL — ABNORMAL LOW (ref >39)
LDL Particle Number: 592 nmol/L (ref <1000)
LDL Size: 20 nm — ABNORMAL LOW (ref >20.5)
LDL-C (NIH Calc): 51 mg/dL (ref 0–99)
LP-IR Score: 67 — ABNORMAL HIGH (ref <=45)
Small LDL Particle Number: 403 nmol/L (ref <=527)
Triglycerides: 100 mg/dL (ref 0–149)

## 2021-07-02 ENCOUNTER — Ambulatory Visit
Admission: RE | Admit: 2021-07-02 | Discharge: 2021-07-02 | Disposition: A | Discharge status: Home / Self Care | Payer: Commercial Managed Care - PPO | Source: Ambulatory Visit | Attending: Family Medicine | Admitting: Family Medicine

## 2021-07-02 DIAGNOSIS — I10 Essential (primary) hypertension: Secondary | ICD-10-CM

## 2021-07-02 DIAGNOSIS — R1013 Epigastric pain: Secondary | ICD-10-CM

## 2021-08-01 ENCOUNTER — Ambulatory Visit: Payer: Commercial Managed Care - PPO | Admitting: Cardiovascular Disease

## 2021-09-07 ENCOUNTER — Other Ambulatory Visit: Payer: Self-pay | Admitting: Family Medicine

## 2021-09-07 DIAGNOSIS — R42 Dizziness and giddiness: Secondary | ICD-10-CM

## 2021-09-18 ENCOUNTER — Other Ambulatory Visit: Payer: Self-pay

## 2021-09-18 ENCOUNTER — Ambulatory Visit
Admission: RE | Admit: 2021-09-18 | Discharge: 2021-09-18 | Disposition: A | Discharge status: Home / Self Care | Payer: Commercial Managed Care - PPO | Source: Ambulatory Visit | Attending: Family Medicine | Admitting: Family Medicine

## 2021-09-18 DIAGNOSIS — R42 Dizziness and giddiness: Secondary | ICD-10-CM

## 2021-09-18 MED ORDER — GADOBENATE DIMEGLUMINE 529 MG/ML IV SOLN
13.0000 mL | Freq: Once | INTRAVENOUS | Status: AC | PRN
  Administered 2021-09-18: 15:00:00 13 mL via INTRAVENOUS

## 2021-10-16 ENCOUNTER — Ambulatory Visit: Payer: Commercial Managed Care - PPO | Admitting: Neurology

## 2021-10-20 ENCOUNTER — Other Ambulatory Visit: Payer: Self-pay | Admitting: Cardiovascular Disease

## 2021-11-01 ENCOUNTER — Encounter: Payer: Self-pay | Admitting: Cardiovascular Disease

## 2021-11-01 ENCOUNTER — Other Ambulatory Visit: Payer: Self-pay

## 2021-11-01 ENCOUNTER — Ambulatory Visit (INDEPENDENT_AMBULATORY_CARE_PROVIDER_SITE_OTHER): Payer: Commercial Managed Care - PPO | Admitting: Cardiovascular Disease

## 2021-11-01 VITALS — BP 120/72 | HR 83 | Ht 63.0 in | Wt 152.0 lb

## 2021-11-01 DIAGNOSIS — R079 Chest pain, unspecified: Secondary | ICD-10-CM

## 2021-11-01 DIAGNOSIS — E782 Mixed hyperlipidemia: Secondary | ICD-10-CM | POA: Diagnosis not present

## 2021-11-01 MED ORDER — METOPROLOL SUCCINATE ER 25 MG PO TB24: 25.0000 mg | ORAL_TABLET | Freq: Every day | ORAL | 1 refills | Status: DC

## 2021-11-01 NOTE — Progress Notes (Signed)
Cardiology Office Note:    Date:  11/01/2021   ID:  Victoria Decker, DOB 06-30-1996, MRN [id number]  PCP:  Aliene Beams, MD  [facility] Cardiologist:  [facility] Electrophysiologist:  None   Referring MD: Aliene Beams, MD   Chief Complaint  Patient presents with   Hyperlipidemia    Jan. 31, 2022   Victoria Decker is a 26 y.o. female with a hx of hyperlipidemia.  We were asked to see her today by Dr Donnie Coffin for further evaluation of her hyperlipidemia.  Master in Surveyor, minerals at [employer] . Cher [employer] for [employer] softball 6 days a week for [place]   Eats an unrestricted diet.  But not all that bad   No CP , no dyspnea   Feb. 2, 2023 Seen with mother , Victoria Decker is seen today for follow up of her HLD + family hx of CAD ( her father Victoria Decker is a patient of mine) We started Rosuvastatin at her last visit Labs from Sept. 2022 revealed a  LDL of 51 LDL particle number of 592 LP-IR score of 67 ( mildly - moderately elevated )  Eats a bagle / cream cheese each morning  [employer] for lunch , sometimes a salad   Is having pain in her chest  Not exertional , not with deep breath .  Might last a few minutes.  , not necessarily with movement  Typically she stops what she is doing and the pain resolved.  No indigestion .  Has not tried acid blocking meds.   Past Medical History:  Diagnosis Date   Hyperlipidemia     Past Surgical History:  Procedure Laterality Date   ARM SURGERY     BROKEN BONE    Current Medications: Current Meds  Medication Sig   ezetimibe (ZETIA) 10 MG tablet Take 1 tablet (10 mg total) by mouth daily.   metoprolol succinate (TOPROL XL) 25 MG 24 hr tablet Take 1 tablet (25 mg total) by mouth daily.   norethindrone (MICRONOR) 0.35 MG tablet Take 1 tablet (0.35 mg total) by mouth daily.   rosuvastatin (CRESTOR) 10 MG tablet Take 1 tablet (10 mg total) by mouth daily. Please keep upcoming appt. In Feb. With Dr.  Elease Hashimoto in order to receive future refills. Thank you.   triamcinolone ointment (KENALOG) 0.1 %      Allergies:   Patient has no known allergies.   Social History   Socioeconomic History   Marital status: Single    Spouse name: Not on file   Number of children: Not on file   Years of education: Not on file   Highest education level: Not on file  Occupational History   Not on file  Tobacco Use   Smoking status: Never   Smokeless tobacco: Never  Vaping Use   Vaping Use: Never used  Substance and Sexual Activity   Alcohol use: No    Alcohol/week: 0.0 standard drinks   Drug use: Never   Sexual activity: Never    Partners: Male    Birth control/protection: OCP  Other Topics Concern   Not on file  Social History Narrative   Not on file   Social Determinants of Health   Financial Resource Strain: Not on file  Food Insecurity: Not on file  Transportation Needs: Not on file  Physical Activity: Not on file  Stress: Not on file  Social Connections: Not on file     Family History: The patient's  family history includes Breast cancer in her paternal aunt; Cancer in her maternal uncle and paternal aunt; Diabetes in her maternal grandfather and maternal grandmother; Heart disease in her maternal grandmother, paternal grandfather, and paternal uncle.  ROS:   Please see the history of present illness.     All other systems reviewed and are negative.  EKGs/Labs/Other Studies Reviewed:    The following studies were reviewed today:   EKG:      Recent Labs: 01/22/2021: BUN 11; Creatinine, Ser 0.83; Potassium 4.4; Sodium 141 04/09/2021: ALT 30 05/25/2021: TSH 4.86  Recent Lipid Panel    Component Value Date/Time   CHOL 239 (H) 10/25/2020 1006   TRIG 213 (H) 10/25/2020 1006   HDL 43 (L) 10/25/2020 1006   CHOLHDL 5.6 (H) 10/25/2020 1006   LDLCALC 158 (H) 10/25/2020 1006     Risk Assessment/Calculations:       Physical Exam:    Physical Exam: Blood pressure 120/72,  pulse 83, height 5\' 3"  (1.6 m), weight 152 lb (68.9 kg), SpO2 99 %.  GEN:  anxioius appearing young female,   NMAD  HEENT: Normal NECK: No JVD; No carotid bruits LYMPHATICS: No lymphadenopathy CARDIAC: RRR ,, hyperdynamic cardiac sounds.  Soft systolic murmur. RESPIRATORY:  Clear to auscultation without rales, wheezing or rhonchi  ABDOMEN: Soft, non-tender, non-distended MUSCULOSKELETAL:  No edema; No deformity  SKIN: Warm and dry NEUROLOGIC:  Alert and oriented x 3   ASSESSMENT:    1. Hyperlipidemia, mixed   2. Chest pain of uncertain etiology     PLAN:      Hyperlipidemia: Lipid levels look great.  Her insulin resistance score is slightly elevated.  I suspect it is because she is eating lots of breads and carbohydrates.  I have advised her to restrict her carbohydrate intake.  I have asked her to increase her exercise.  2.  Chest pain :   she is having some atypical CP .   I do not think its cardiac.   ? MVP equivalent.   ? Stress / anxiety   Will get an echo. Start Toprol XL 25 mg a day   Will see her back in 3 months .    Medication Adjustments/Labs and Tests Ordered: Current medicines are reviewed at length with the patient today.  Concerns regarding medicines are outlined above.  Orders Placed This Encounter  Procedures   EKG 12-Lead   ECHOCARDIOGRAM COMPLETE   Meds ordered this encounter  Medications   metoprolol succinate (TOPROL XL) 25 MG 24 hr tablet    Sig: Take 1 tablet (25 mg total) by mouth daily.    Dispense:  90 tablet    Refill:  1    Patient Instructions  Medication Instructions:  Your physician has recommended you make the following change in your medication:   START Toprol Xl 25 mg taking 1 daily    *If you need a refill on your cardiac medications before your next appointment, please call your pharmacy*   Lab Work: None ordered  If you have labs (blood work) drawn today and your tests are completely normal, you will receive your  results only by: MyChart Message (if you have MyChart) OR A paper copy in the mail If you have any lab test that is abnormal or we need to change your treatment, we will call you to review the results.   Testing/Procedures: Your physician has requested that you have an echocardiogram. Echocardiography is a painless test that uses sound waves to create  images of your heart. It provides your doctor with information about the size and shape of your heart and how well your hearts chambers and valves are working. This procedure takes approximately one hour. There are no restrictions for this procedure.     Follow-Up: At [facility], you and your health needs are our priority.  As part of our continuing mission to provide you with exceptional heart care, we have created designated Provider Care Teams.  These Care Teams include your primary Cardiologist (physician) and Advanced Practice Providers (APPs -  Physician Assistants and Nurse Practitioners) who all work together to provide you with the care you need, when you need it.  We recommend signing up for the patient portal called "MyChart".  Sign up information is provided on this After Visit Summary.  MyChart is used to connect with patients for Virtual Visits (Telemedicine).  Patients are able to view lab/test results, encounter notes, upcoming appointments, etc.  Non-urgent messages can be sent to your provider as well.   To learn more about what you can do with MyChart, go to [email or web address].    Your next appointment:   3 month(s)  The format for your next appointment:   In Person  Provider:   Kristeen MissPhilip Travonte Byard, MD  or Chelsea AusVin Bhagat, PA-C, Eligha BridegroomMichelle Swinyer, NP, or Tereso NewcomerScott Weaver, PA-C         Other Instructions   Signed, Kristeen MissPhilip Elbony Mcclimans, MD  11/01/2021 5:07 PM    [facility]

## 2021-11-01 NOTE — Patient Instructions (Addendum)
Medication Instructions:  Your physician has recommended you make the following change in your medication:   START Toprol Xl 25 mg taking 1 daily    *If you need a refill on your cardiac medications before your next appointment, please call your pharmacy*   Lab Work: None ordered  If you have labs (blood work) drawn today and your tests are completely normal, you will receive your results only by: [place] (if you have MyChart) OR A paper copy in the mail If you have any lab test that is abnormal or we need to change your treatment, we will call you to review the results.   Testing/Procedures: Your physician has requested that you have an echocardiogram. Echocardiography is a painless test that uses sound waves to create images of your heart. It provides your doctor with information about the size and shape of your heart and how well your hearts chambers and valves are working. This procedure takes approximately one hour. There are no restrictions for this procedure.     Follow-Up: At [facility], you and your health needs are our priority.  As part of our continuing mission to provide you with exceptional heart care, we have created designated Provider Care Teams.  These Care Teams include your primary Cardiologist (physician) and Advanced Practice Providers (APPs -  Physician Assistants and Nurse Practitioners) who all work together to provide you with the care you need, when you need it.  We recommend signing up for the patient portal called "MyChart".  Sign up information is provided on this After Visit Summary.  MyChart is used to connect with patients for Virtual Visits (Telemedicine).  Patients are able to view lab/test results, encounter notes, upcoming appointments, etc.  Non-urgent messages can be sent to your provider as well.   To learn more about what you can do with MyChart, go to [email or web address].    Your next appointment:   3 month(s)  The format for  your next appointment:   In Person  Provider:   Mertie Moores, MD  or Robbie Lis, PA-C, Christen Bame, NP, or Richardson Dopp, PA-C         Other Instructions

## 2021-11-06 ENCOUNTER — Other Ambulatory Visit: Payer: Self-pay

## 2021-11-06 ENCOUNTER — Ambulatory Visit (HOSPITAL_COMMUNITY): Payer: Commercial Managed Care - PPO | Attending: Cardiology

## 2021-11-06 DIAGNOSIS — R079 Chest pain, unspecified: Secondary | ICD-10-CM | POA: Diagnosis not present

## 2021-11-06 DIAGNOSIS — E782 Mixed hyperlipidemia: Secondary | ICD-10-CM | POA: Diagnosis present

## 2021-11-06 LAB — ECHOCARDIOGRAM COMPLETE
Area-P 1/2: 2.91 cm2
S' Lateral: 2.7 cm

## 2022-01-17 ENCOUNTER — Other Ambulatory Visit: Payer: Self-pay | Admitting: Cardiovascular Disease

## 2022-01-29 ENCOUNTER — Encounter: Payer: Self-pay | Admitting: Cardiovascular Disease

## 2022-01-29 ENCOUNTER — Ambulatory Visit (INDEPENDENT_AMBULATORY_CARE_PROVIDER_SITE_OTHER): Payer: Commercial Managed Care - PPO | Admitting: Cardiovascular Disease

## 2022-01-29 VITALS — BP 124/70 | HR 73 | Ht 63.0 in | Wt 153.0 lb

## 2022-01-29 DIAGNOSIS — R079 Chest pain, unspecified: Secondary | ICD-10-CM | POA: Diagnosis not present

## 2022-01-29 DIAGNOSIS — R002 Palpitations: Secondary | ICD-10-CM

## 2022-01-29 DIAGNOSIS — E782 Mixed hyperlipidemia: Secondary | ICD-10-CM | POA: Diagnosis not present

## 2022-01-29 MED ORDER — METOPROLOL SUCCINATE ER 25 MG PO TB24: 25.0000 mg | ORAL_TABLET | Freq: Every day | ORAL | 3 refills | Status: AC

## 2022-01-29 NOTE — Progress Notes (Signed)
?Cardiology Office Note:   ? ?Date:  01/29/2022  ? ?ID:  Victoria Decker, DOB August 20, 1996, MRN [id number] ? ?PCP:  Aliene Beams, MD  ?[facility] Cardiologist:  Boyd Litaker ? ?[facility] Electrophysiologist:  None  ? ?Referring MD: Aliene Beams, MD  ? ?Chief Complaint  ?Patient presents with  ? Palpitations  ? ? ?Jan. 31, 2022  ? ?Victoria Decker is a 26 y.o. female with a hx of hyperlipidemia.  We were asked to see her today by Dr Donnie Coffin for further evaluation of her hyperlipidemia. ? 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 ?[employer] for Undergrad  ? ?Plays softball 6 days a week for [place]  ? ?Eats an unrestricted diet.  ?But not all that bad  ? ?No CP , no dyspnea  ? ?Feb. 2, 2023 ?Seen with mother , Victoria Decker ?Victoria Decker is seen today for follow up of her HLD ?+ family hx of CAD ( her father Ferne Reus is a patient of mine) ?We started Rosuvastatin at her last visit ?Labs from Sept. 2022 revealed a  ?LDL of 51 ?LDL particle number of 592 ?LP-IR score of 67 ( mildly - moderately elevated )  ?Eats a bagle / cream cheese each morning  ?[employer] for lunch , sometimes a salad  ? ?Is having pain in her chest  ?Not exertional , not with deep breath .  ?Might last a few minutes.  , not necessarily with movement  ?Typically she stops what she is doing and the pain resolved.  ?No indigestion .  ?Has not tried acid blocking meds.  ? ?Jan 29, 2022 ?Seen with her mother , Victoria Decker  ?Cp are not as bad  ?Is walking her dog ?No real cardio exercise  ? ?CP is mid sternal chest pressure .  Radiates out to her left side ?Occasionally worsened with deep breath  ?Wt is 153 lbs. ( + 1 lbs from last year )  ? ?Mom wonders if crestor could be causing the cp  ? ? ?Past Medical History:  ?Diagnosis Date  ? Hyperlipidemia   ? ? ?Past Surgical History:  ?Procedure Laterality Date  ? ARM SURGERY    ? BROKEN BONE  ? ? ?Current Medications: ?Current Meds  ?Medication Sig  ? clobetasol cream (TEMOVATE) 0.05 % Apply topically at bedtime as needed.  ?  norethindrone (MICRONOR) 0.35 MG tablet Take 1 tablet (0.35 mg total) by mouth daily.  ? triamcinolone ointment (KENALOG) 0.1 %   ? [DISCONTINUED] ezetimibe (ZETIA) 10 MG tablet Take 1 tablet (10 mg total) by mouth daily.  ? [DISCONTINUED] metoprolol succinate (TOPROL XL) 25 MG 24 hr tablet Take 1 tablet (25 mg total) by mouth daily.  ? [DISCONTINUED] rosuvastatin (CRESTOR) 10 MG tablet Take 1 tablet (10 mg total) by mouth daily.  ?  ? ?Allergies:   Patient has no known allergies.  ? ?Social History  ? ?Socioeconomic History  ? Marital status: Single  ?  Spouse name: Not on file  ? Number of children: Not on file  ? Years of education: Not on file  ? Highest education level: Not on file  ?Occupational History  ? Not on file  ?Tobacco Use  ? Smoking status: Never  ? Smokeless tobacco: Never  ?Vaping Use  ? Vaping Use: Never used  ?Substance and Sexual Activity  ? Alcohol use: No  ?  Alcohol/week: 0.0 standard drinks  ? Drug use: Never  ? Sexual activity: Never  ?  Partners: Male  ?  Birth  control/protection: OCP  ?Other Topics Concern  ? Not on file  ?Social History Narrative  ? Not on file  ? ?Social Determinants of Health  ? ?Financial Resource Strain: Not on file  ?Food Insecurity: Not on file  ?Transportation Needs: Not on file  ?Physical Activity: Not on file  ?Stress: Not on file  ?Social Connections: Not on file  ?  ? ?Family History: ?The patient's family history includes Breast cancer in her paternal aunt; Cancer in her maternal uncle and paternal aunt; Diabetes in her maternal grandfather and maternal grandmother; Heart disease in her maternal grandmother, paternal grandfather, and paternal uncle. ? ?ROS:   ?Please see the history of present illness.    ? All other systems reviewed and are negative. ? ?EKGs/Labs/Other Studies Reviewed:   ? ?The following studies were reviewed today: ? ? ?EKG:    ? ? ?Recent Labs: ?04/09/2021: ALT 30 ?05/25/2021: TSH 4.86  ?Recent Lipid Panel ?   ?Component Value Date/Time  ?  CHOL 239 (H) 10/25/2020 1006  ? TRIG 213 (H) 10/25/2020 1006  ? HDL 43 (L) 10/25/2020 1006  ? CHOLHDL 5.6 (H) 10/25/2020 1006  ? LDLCALC 158 (H) 10/25/2020 1006  ? ? ? ?Risk Assessment/Calculations:   ?  ? ? ?Physical Exam:   ? ?Physical Exam: ?Blood pressure 124/70, pulse 73, height 5\' 3"  (1.6 m), weight 153 lb (69.4 kg), SpO2 99 %. ? ?GEN:  m, well developed in no acute distress ?HEENT: Normal ?NECK: No JVD; No carotid bruits ?LYMPHATICS: No lymphadenopathy ?CARDIAC: RRR , no murmurs, rubs, gallops ?RESPIRATORY:  Clear to auscultation without rales, wheezing or rhonchi  ?ABDOMEN: Soft, non-tender, non-distended ?MUSCULOSKELETAL:  No edema; No deformity  ?SKIN: Warm and dry ?NEUROLOGIC:  Alert and oriented x 3 ? ? ? ?ASSESSMENT:   ? ?1. Chest pain of uncertain etiology   ?2. Mixed hyperlipidemia   ?3. Palpitations   ? ? ? ?PLAN:   ? ? ? ?Hyperlipidemia: Her insulin resistance score is elevated.  I have strongly encouraged her to watch her diet and to start exercising regularly.  I encouraged her to lose some weight. ? ?2.  Chest pain :   ?Echocardiogram shows normal left ventricular systolic function.  She does not have any significant valvular abnormalities.  She thinks the pain may be a little bit better on Toprol. ? ?Her mom is wondering whether or not they cholesterol medicines are contributing to the chest pain.  We will hold the Crestor and hold the Zetia for now to see if this makes any difference. ? ?There is also a possibility that that her previous birth control pill may have been helping managed her hormones better.  This may be contributing to her chest pain.  She will talk with her GYN doctor to see if she can change back to the other birth control pill. ? ?There was some concern that the other birth control pill might cause hypertension.  I suspect suspect with a better diet, exercise, weight loss plan that she can avoid hypertension for the most part.  We can certainly add additional medications if  she still needs blood pressure control. ? 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  Her dermatologist wants to start her on spironolactone 100 mg tablets.  I think this would be okay but I think that her blood pressure should be able to tolerate it.  I would start her on 50 mg for the first week just to make sure that  her blood pressure stays stable. ? ?Will see her back in 6 ? ? ? ?Medication Adjustments/Labs and Tests Ordered: ?Current medicines are reviewed at length with the patient today.  Concerns regarding medicines are outlined above.  ?No orders of the defined types were placed in this encounter. ? ?Meds ordered this encounter  ?Medications  ? metoprolol succinate (TOPROL XL) 25 MG 24 hr tablet  ?  Sig: Take 1 tablet (25 mg total) by mouth daily.  ?  Dispense:  90 tablet  ?  Refill:  3  ? ? ?Patient Instructions  ?Medication Instructions:  ?STOP Zetia (Ezetimibe) ?STOP Crestor (Rosuvastatin) ?*If you need a refill on your cardiac medications before your next appointment, please call your pharmacy* ? ? ?Lab Work: ?NONE ?If you have labs (blood work) drawn today and your tests are completely normal, you will receive your results only by: ?MyChart Message (if you have MyChart) OR ?A paper copy in the mail ?If you have any lab test that is abnormal or we need to change your treatment, we will call you to review the results. ? ? ?Testing/Procedures: ?NONE ? ? ?Follow-Up: ?At [facility], you and your health needs are our priority.  As part of our continuing mission to provide you with exceptional heart care, we have created designated Provider Care Teams.  These Care Teams include your primary Cardiologist (physician) and Advanced Practice Providers (APPs -  Physician Assistants and Nurse Practitioners) who all work together to provide you with the care you need, when you need it. ? ?Your next appointment:   ?6 month(s) ? ?The format for your next appointment:   ?In Person ? ?Provider:    ?Doran Durand, [place] ? ?  ? ?Important Information About Sugar ? ? ? ? ?   ? ?Signed, ?Kristeen Miss, MD  ?01/29/2022 5:29 PM    ?[facility] ? ?

## 2022-01-29 NOTE — Patient Instructions (Signed)
Medication Instructions:  ?STOP Zetia (Ezetimibe) ?STOP Crestor (Rosuvastatin) ?*If you need a refill on your cardiac medications before your next appointment, please call your pharmacy* ? ? ?Lab Work: ?NONE ?If you have labs (blood work) drawn today and your tests are completely normal, you will receive your results only by: ?MyChart Message (if you have MyChart) OR ?A paper copy in the mail ?If you have any lab test that is abnormal or we need to change your treatment, we will call you to review the results. ? ? ?Testing/Procedures: ?NONE ? ? ?Follow-Up: ?At [facility], you and your health needs are our priority.  As part of our continuing mission to provide you with exceptional heart care, we have created designated Provider Care Teams.  These Care Teams include your primary Cardiologist (physician) and Advanced Practice Providers (APPs -  Physician Assistants and Nurse Practitioners) who all work together to provide you with the care you need, when you need it. ? ?Your next appointment:   ?6 month(s) ? ?The format for your next appointment:   ?In Person ? ?Provider:   ?Doran Durand, [place] ? ?  ? ?Important Information About Sugar ? ? ? ? ?  ?

## 2022-07-16 ENCOUNTER — Other Ambulatory Visit: Payer: Self-pay | Admitting: Obstetrics & Gynecology

## 2022-07-16 NOTE — Telephone Encounter (Signed)
Last annual exam was 09/2020 No exam scheduled.

## 2022-07-30 ENCOUNTER — Other Ambulatory Visit: Payer: Self-pay | Admitting: Obstetrics & Gynecology

## 2022-07-30 ENCOUNTER — Ambulatory Visit: Payer: Commercial Managed Care - PPO | Attending: Physician Assistant | Admitting: Physician Assistant

## 2022-07-30 VITALS — BP 126/74 | HR 80 | Ht 63.0 in | Wt 151.8 lb

## 2022-07-30 DIAGNOSIS — E782 Mixed hyperlipidemia: Secondary | ICD-10-CM | POA: Diagnosis not present

## 2022-07-30 DIAGNOSIS — R002 Palpitations: Secondary | ICD-10-CM

## 2022-07-30 DIAGNOSIS — R079 Chest pain, unspecified: Secondary | ICD-10-CM | POA: Diagnosis not present

## 2022-07-30 NOTE — Progress Notes (Signed)
Cardiology Office Note:    Date:  07/30/2022   ID:  Victoria Decker, DOB Feb 21, 1996, MRN [id number]  PCP:  Caren Macadam, MD  [facility] Cardiologist:  Mertie Moores, MD  [facility] Electrophysiologist:  None   Chief Complaint: 6 month follow up   History of Present Illness:    Victoria Decker is a 26 y.o. female with a hx of HLD seen for follow up.   Family hx of CAD. 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 Had some chest pain during 10/2021 visit. Echo with normal LVEF at 60-65%. No regional WM abnormality. Last seen 01/2022, stopped statin (Crestor and Zetia) to see if this helps if chest pain or not.   No results found for requested labs within last 365 days.  Last LDL 51 on September 2022.  Here today for follow-up with mother.  Father with history of MI at age 52.  Other family member with history of CAD in their 3s. Patient reports no recurrent chest pain or muscle aching since discontinuation of Crestor or Zetia.  Discontinued metoprolol due to fatigue feeling.  Heart rate and blood pressure excellent today.  She was on metoprolol for palpitation.  No reoccurrence. She is trying to join police force.  She denies chest pain, shortness of breath, orthopnea, PND, syncope, lower extremity edema or melena.   Past Medical History:  Diagnosis Date   Hyperlipidemia     Past Surgical History:  Procedure Laterality Date   ARM SURGERY     BROKEN BONE    Current Medications: Current Meds  Medication Sig   clobetasol cream (TEMOVATE) 0.05 % Apply topically at bedtime as needed.   norethindrone (MICRONOR) 0.35 MG tablet Take 1 tablet (0.35 mg total) by mouth daily.   triamcinolone ointment (KENALOG) 0.1 %      Allergies:   Patient has no known allergies.   Social History   Socioeconomic History   Marital status: Single    Spouse name: Not on file   Number of children: Not on file   Years of education: Not on file   Highest education level: Not on file   Occupational History   Not on file  Tobacco Use   Smoking status: Never   Smokeless tobacco: Never  Vaping Use   Vaping Use: Never used  Substance and Sexual Activity   Alcohol use: No    Alcohol/week: 0.0 standard drinks of alcohol   Drug use: Never   Sexual activity: Never    Partners: Male    Birth control/protection: OCP  Other Topics Concern   Not on file  Social History Narrative   Not on file   Social Determinants of Health   Financial Resource Strain: Not on file  Food Insecurity: Not on file  Transportation Needs: Not on file  Physical Activity: Not on file  Stress: Not on file  Social Connections: Not on file     Family History: The patient's family history includes Breast cancer in her paternal aunt; Cancer in her maternal uncle and paternal aunt; Diabetes in her maternal grandfather and maternal grandmother; Heart disease in her maternal grandmother, paternal grandfather, and paternal uncle.    ROS:   Please see the history of present illness.    All other systems reviewed and are negative.   EKGs/Labs/Other Studies Reviewed:    The following studies were reviewed today:  Echo 10/2021 1. Left ventricular ejection fraction, by estimation, is 60 to 65%. Left  ventricular ejection fraction by 3D  volume is 63 %. The left ventricle has  normal function. The left ventricle has no regional wall motion  abnormalities. Left ventricular diastolic   parameters were normal. The average left ventricular global longitudinal  strain is 22.3 %. The global longitudinal strain is normal.   2. Right ventricular systolic function is normal. The right ventricular  size is normal. There is normal pulmonary artery systolic pressure.   3. The mitral valve is normal in structure. No evidence of mitral valve  regurgitation. No evidence of mitral stenosis.   4. The aortic valve is normal in structure. Aortic valve regurgitation is  not visualized. No aortic stenosis is present.    5. The inferior vena cava is normal in size with greater than 50%  respiratory variability, suggesting right atrial pressure of 3 mmHg.   Comparison(s): No prior Echocardiogram.  EKG:  EKG is not  ordered today.    Recent Labs: No results found for requested labs within last 365 days.  Recent Lipid Panel    Component Value Date/Time   CHOL 239 (H) 10/25/2020 1006   TRIG 213 (H) 10/25/2020 1006   HDL 43 (L) 10/25/2020 1006   CHOLHDL 5.6 (H) 10/25/2020 1006   LDLCALC 158 (H) 10/25/2020 1006    Physical Exam:    VS:  BP 126/74   Pulse 80   Ht 5\' 3"  (1.6 m)   Wt 151 lb 12.8 oz (68.9 kg)   SpO2 97%   BMI 26.89 kg/m     Wt Readings from Last 3 Encounters:  07/30/22 151 lb 12.8 oz (68.9 kg)  01/29/22 153 lb (69.4 kg)  11/01/21 152 lb (68.9 kg)     GEN:  Well nourished, well developed in no acute distress HEENT: Normal NECK: No JVD; No carotid bruits LYMPHATICS: No lymphadenopathy CARDIAC: RRR, no murmurs, rubs, gallops RESPIRATORY:  Clear to auscultation without rales, wheezing or rhonchi  ABDOMEN: Soft, non-tender, non-distended MUSCULOSKELETAL:  No edema; No deformity  SKIN: Warm and dry NEUROLOGIC:  Alert and oriented x 3 PSYCHIATRIC:  Normal affect   ASSESSMENT AND PLAN:    Family history of early CAD History of chest pain and palpitation No recurrent chest pain or heart muscle ache since discontinuation of Crestor and Zetia.  Toprol-XL discontinued due to fatigue.  She denies any symptoms currently.  Blood pressure and heart rate excellently controlled.  Reviewed preventive therapy with exercise and diet. She will have lipids check by PCP.    Medication Adjustments/Labs and Tests Ordered: Current medicines are reviewed at length with the patient today.  Concerns regarding medicines are outlined above.  No orders of the defined types were placed in this encounter.  No orders of the defined types were placed in this encounter.   Patient Instructions   Medication Instructions:  Your physician recommends that you continue on your current medications as directed. Please refer to the Current Medication list given to you today. *If you need a refill on your cardiac medications before your next appointment, please call your pharmacy*   Lab Work: NONE ORDERED   Testing/Procedures: NONE ORDERED   Follow-Up: At [facility], you and your health needs are our priority.  As part of our continuing mission to provide you with exceptional heart care, we have created designated Provider Care Teams.  These Care Teams include your primary Cardiologist (physician) and Advanced Practice Providers (APPs -  Physician Assistants and Nurse Practitioners) who all work together to provide you with the care you need, when  you need it.  We recommend signing up for the patient portal called "MyChart".  Sign up information is provided on this After Visit Summary.  MyChart is used to connect with patients for Virtual Visits (Telemedicine).  Patients are able to view lab/test results, encounter notes, upcoming appointments, etc.  Non-urgent messages can be sent to your provider as well.   To learn more about what you can do with MyChart, go to [email or web address].    Your next appointment:   12 month(s)  The format for your next appointment:   In Person  Provider:   Mertie Moores, MD     Other Instructions   Important Information About Sugar         Jarrett Soho, [place]  07/30/2022 3:46 PM    [facility]

## 2022-07-30 NOTE — Patient Instructions (Signed)
Medication Instructions:  Your physician recommends that you continue on your current medications as directed. Please refer to the Current Medication list given to you today. *If you need a refill on your cardiac medications before your next appointment, please call your pharmacy*   Lab Work: NONE ORDERED   Testing/Procedures: NONE ORDERED   Follow-Up: At [facility], you and your health needs are our priority.  As part of our continuing mission to provide you with exceptional heart care, we have created designated Provider Care Teams.  These Care Teams include your primary Cardiologist (physician) and Advanced Practice Providers (APPs -  Physician Assistants and Nurse Practitioners) who all work together to provide you with the care you need, when you need it.  We recommend signing up for the patient portal called "MyChart".  Sign up information is provided on this After Visit Summary.  MyChart is used to connect with patients for Virtual Visits (Telemedicine).  Patients are able to view lab/test results, encounter notes, upcoming appointments, etc.  Non-urgent messages can be sent to your provider as well.   To learn more about what you can do with MyChart, go to [email or web address].    Your next appointment:   12 month(s)  The format for your next appointment:   In Person  Provider:   Mertie Moores, MD     Other Instructions   Important Information About Sugar

## 2023-10-02 IMAGING — MR MR HEAD WO/W CM
12 series · 48 of 48 positions shown · IV contrast (13ml Multihance)
Comparison: None.

CLINICAL DATA: Dizziness.  Body weakness.

EXAM:
MRI HEAD WITHOUT AND WITH CONTRAST
TECHNIQUE: Multiplanar, multiecho pulse sequences of the brain and surrounding
structures were obtained without and with intravenous contrast.
CONTRAST:  13mL MULTIHANCE GADOBENATE DIMEGLUMINE 529 MG/ML IV SOLN

[Series 2: T1 · sagittal · 5.0mm · 0.47mm/px · 2 of 25 slices shown]
[im 1/25]
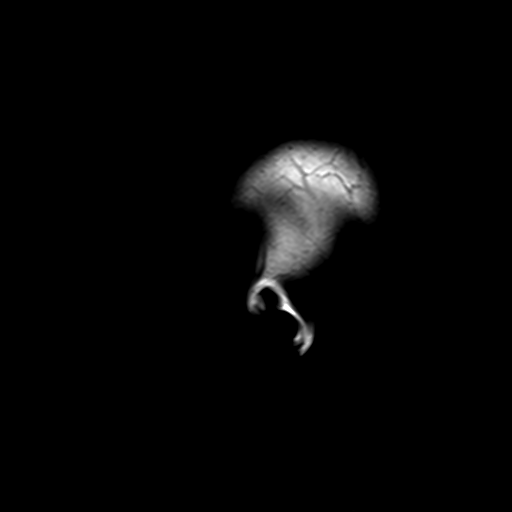
[im 25/25]
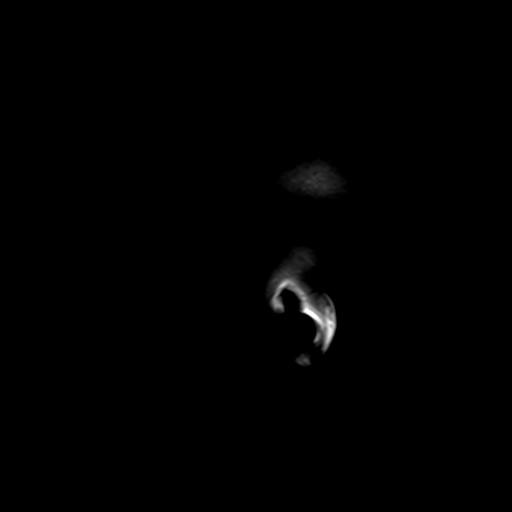

[Series 3: DWI · axial · 3.0mm · 1.88mm/px · z∈[-72,+83]mm · 7 of 108 slices shown (1 of 4)]
[im 1/108]
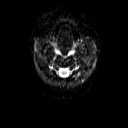
[im 18/108]
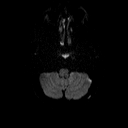
[im 36/108]
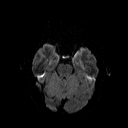
[im 54/108]
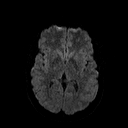
[im 72/108]
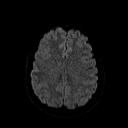
[im 90/108]
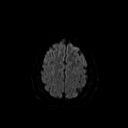
[im 108/108]
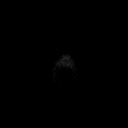

[Series 4: DWI · axial · 3.0mm · 1.88mm/px · z∈[-72,+83]mm · 3 of 53 slices shown (2 of 4)]
[im 1/53]
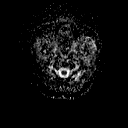
[im 27/53]
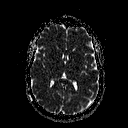
[im 53/53]
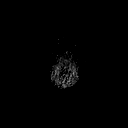

[Series 5: DWI · coronal · 5.0mm · 1.80mm/px · 5 of 77 slices shown (3 of 4)]
[im 1/77]
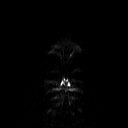
[im 20/77]
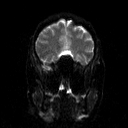
[im 39/77]
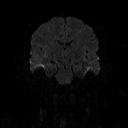
[im 58/77]
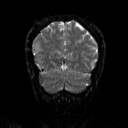
[im 77/77]
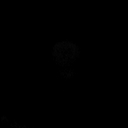

[Series 6: DWI · coronal · 5.0mm · 1.80mm/px · 2 of 39 slices shown (4 of 4)]
[im 1/39]
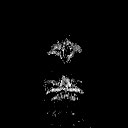
[im 39/39]
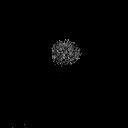

[Series 7: T2 · axial · 5.0mm · 0.75mm/px · 1 of 24 slices shown (1 of 2)]
[im 1/24]
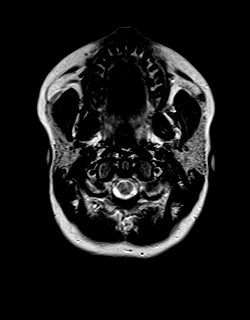

[Series 8: FLAIR · axial · 3.0mm · 0.47mm/px · z∈[-72,+81]mm · 2 of 35 slices shown]
[im 1/35]
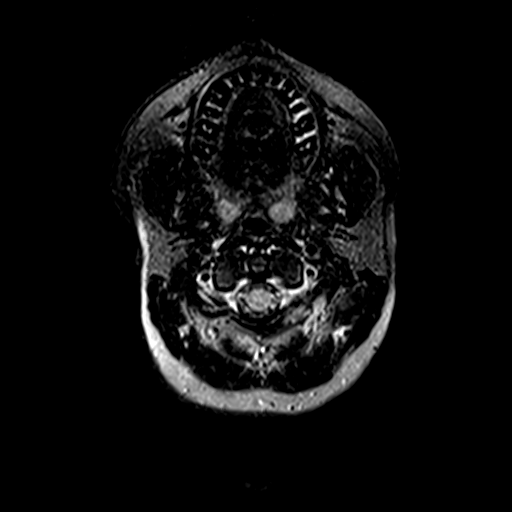
[im 35/35]
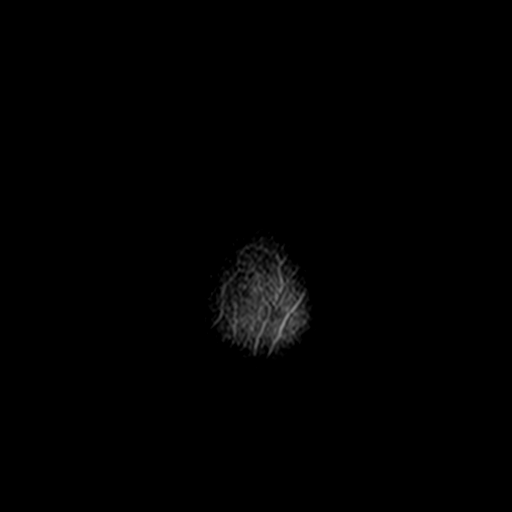

[Series 10: swi_images · axial · 4.0mm · 0.94mm/px · z∈[-63,+91]mm · 2 of 40 slices shown]
[im 1/40]
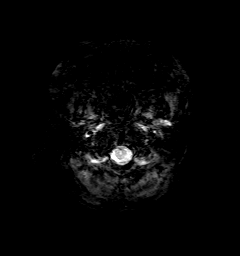
[im 40/40]
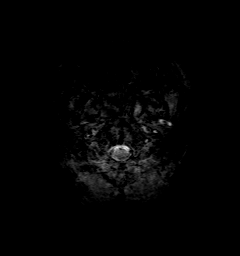

[Series 11: t1_mpr_tra · axial · 1.0mm · 0.75mm/px · z∈[-71,+83]mm · 10 of 160 slices shown]
[im 1/160]
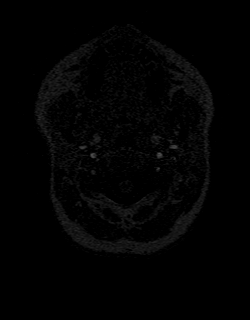
[im 18/160]
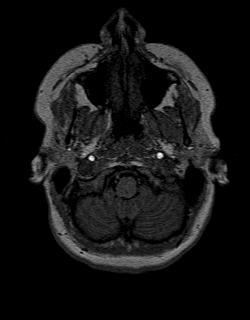
[im 36/160]
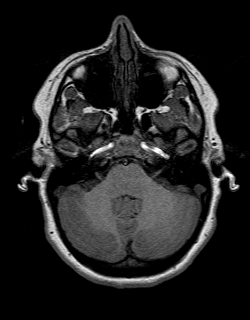
[im 54/160]
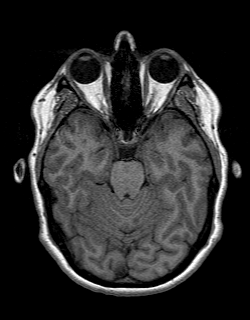
[im 71/160]
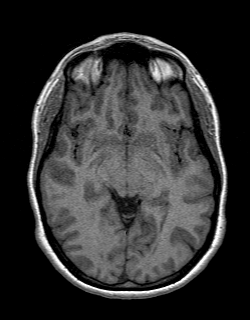
[im 89/160]
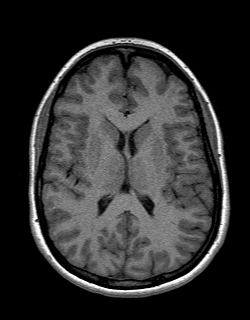
[im 107/160]
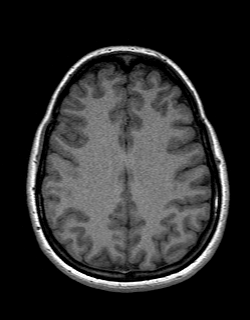
[im 124/160]
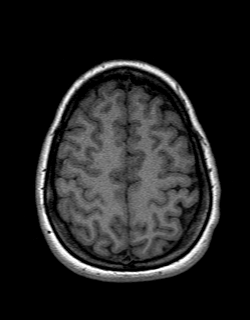
[im 142/160]
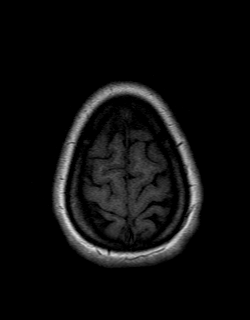
[im 160/160]
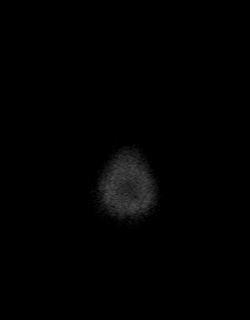

[Series 12: T2 · coronal · 5.0mm · 0.45mm/px · 2 of 29 slices shown (2 of 2)]
[im 1/29]
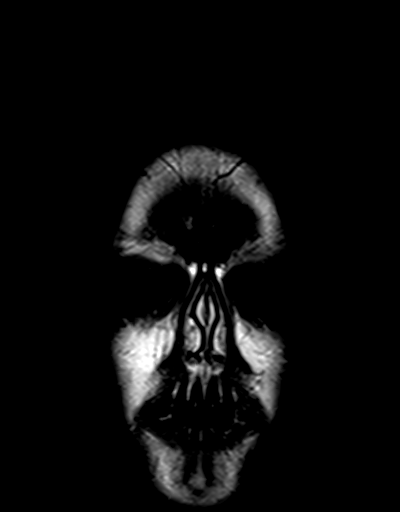
[im 29/29]
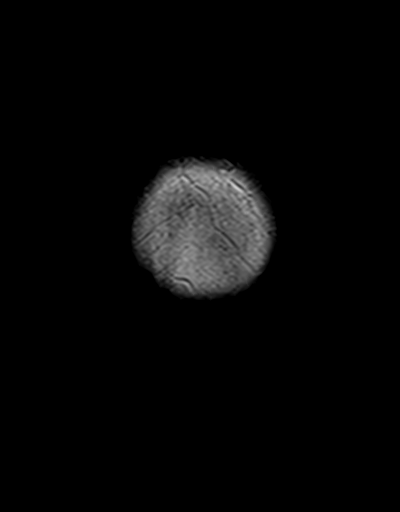

[Series 13: t1_mpr_tra post · axial · 1.0mm · 0.75mm/px · z∈[-71,+83]mm · 10 of 160 slices shown]
[im 1/160]
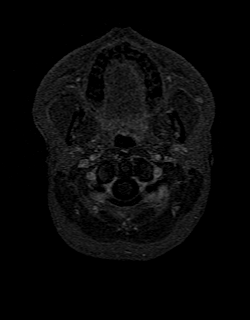
[im 18/160]
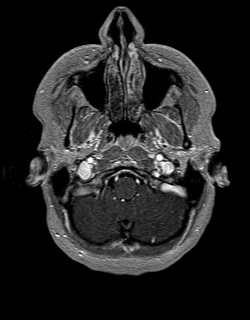
[im 36/160]
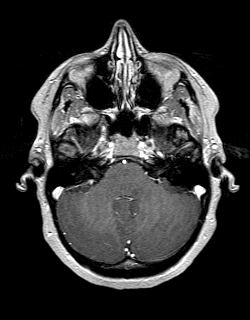
[im 54/160]
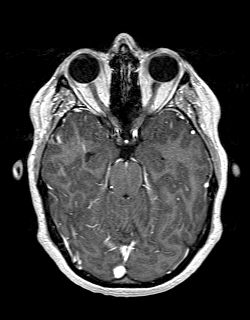
[im 71/160]
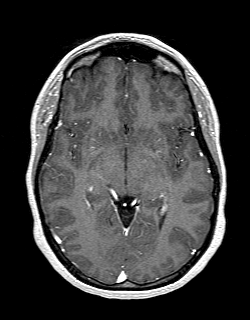
[im 89/160]
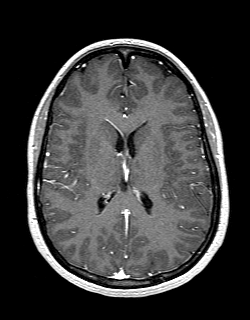
[im 107/160]
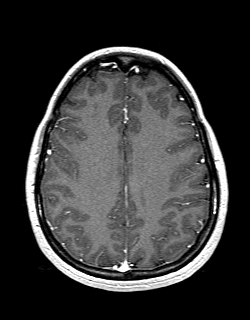
[im 124/160]
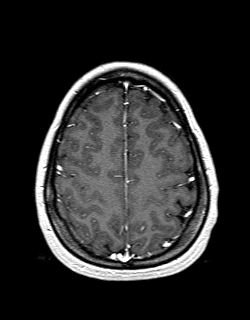
[im 142/160]
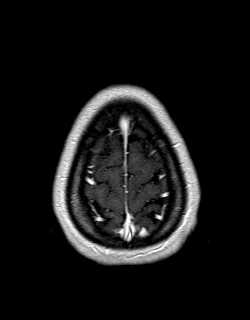
[im 160/160]
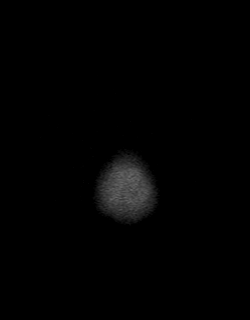

[Series 14: post cor · coronal · 5.0mm · 0.45mm/px · 2 of 29 slices shown]
[im 1/29]
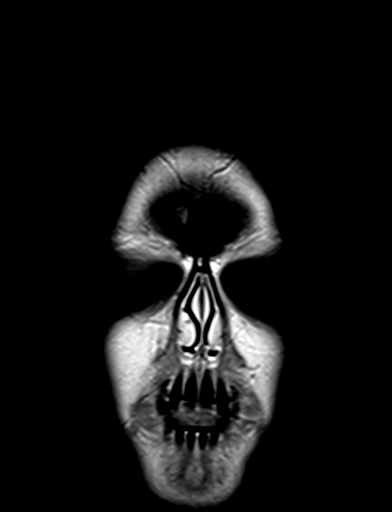
[im 29/29]
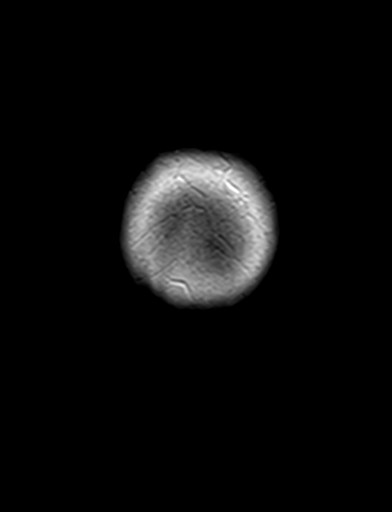

[48 of 48 positions shown; findings below may reference images not displayed]

FINDINGS: Brain: No acute infarction, hemorrhage, hydrocephalus, extra-axial
collection or mass lesion. Normal white matter. Negative for
demyelinating disease.

Normal enhancement.

Vascular: Normal arterial flow voids.

Skull and upper cervical spine: No focal skeletal lesion.

Sinuses/Orbits: Negative

Other: None
IMPRESSION: Normal MRI head with contrast.

These results will be called to the ordering clinician or
representative by the Radiologist Assistant, and communication
documented in the PACS or [REDACTED].
